# Patient Record
Sex: Male | Born: 1935 | Race: White | Hispanic: No | Marital: Married | Smoking: Never smoker
Health system: Southern US, Community
[De-identification: ages and names within clinical notes are randomized; demographics above are authoritative.]

## PROBLEM LIST (undated history)

## (undated) DIAGNOSIS — I4891 Unspecified atrial fibrillation: Secondary | ICD-10-CM

## (undated) DIAGNOSIS — E785 Hyperlipidemia, unspecified: Secondary | ICD-10-CM

## (undated) DIAGNOSIS — E119 Type 2 diabetes mellitus without complications: Secondary | ICD-10-CM

## (undated) DIAGNOSIS — I10 Essential (primary) hypertension: Secondary | ICD-10-CM

## (undated) DIAGNOSIS — M810 Age-related osteoporosis without current pathological fracture: Secondary | ICD-10-CM

## (undated) HISTORY — PX: SHOULDER ARTHROCENTESIS: SHX1048

## (undated) HISTORY — PX: CHOLECYSTECTOMY: SHX55

## (undated) HISTORY — PX: CARDIAC CATHETERIZATION: SHX172

## (undated) HISTORY — PX: OTHER SURGICAL HISTORY: SHX169

---

## 2004-07-09 ENCOUNTER — Ambulatory Visit: Payer: Self-pay | Admitting: Cardiology

## 2004-08-13 ENCOUNTER — Ambulatory Visit: Payer: Self-pay

## 2004-10-07 ENCOUNTER — Ambulatory Visit: Payer: Self-pay | Admitting: Orthopaedic Surgery

## 2004-10-30 ENCOUNTER — Ambulatory Visit: Payer: Self-pay | Admitting: Orthopaedic Surgery

## 2006-06-01 ENCOUNTER — Inpatient Hospital Stay: Payer: Self-pay | Admitting: Internal Medicine

## 2006-06-01 ENCOUNTER — Ambulatory Visit: Payer: Self-pay | Admitting: Internal Medicine

## 2006-07-12 ENCOUNTER — Ambulatory Visit: Payer: Self-pay | Admitting: Gastroenterology

## 2009-08-27 ENCOUNTER — Emergency Department: Payer: Self-pay | Admitting: Unknown Physician Specialty

## 2009-09-03 ENCOUNTER — Ambulatory Visit: Payer: Self-pay | Admitting: Orthopedic Surgery

## 2009-09-24 ENCOUNTER — Encounter: Payer: Self-pay | Admitting: Orthopedic Surgery

## 2009-09-27 ENCOUNTER — Encounter: Payer: Self-pay | Admitting: Orthopedic Surgery

## 2009-10-28 ENCOUNTER — Encounter: Payer: Self-pay | Admitting: Orthopedic Surgery

## 2009-11-28 ENCOUNTER — Encounter: Payer: Self-pay | Admitting: Orthopedic Surgery

## 2009-12-28 ENCOUNTER — Encounter: Payer: Self-pay | Admitting: Orthopedic Surgery

## 2011-02-10 ENCOUNTER — Ambulatory Visit: Payer: Self-pay | Admitting: Internal Medicine

## 2011-02-11 ENCOUNTER — Ambulatory Visit: Payer: Self-pay | Admitting: Gastroenterology

## 2011-02-12 ENCOUNTER — Ambulatory Visit: Payer: Self-pay | Admitting: Gastroenterology

## 2011-03-09 ENCOUNTER — Ambulatory Visit: Payer: Self-pay | Admitting: Surgery

## 2011-03-11 LAB — PATHOLOGY REPORT

## 2012-11-30 ENCOUNTER — Inpatient Hospital Stay: Payer: Self-pay | Admitting: Orthopedic Surgery

## 2012-11-30 LAB — CBC
HCT: 40.4 % (ref 40.0–52.0)
HGB: 13.8 g/dL (ref 13.0–18.0)
MCH: 31.4 pg (ref 26.0–34.0)
MCHC: 34.2 g/dL (ref 32.0–36.0)
Platelet: 170 10*3/uL (ref 150–440)
RBC: 4.4 10*6/uL (ref 4.40–5.90)
RDW: 13.5 % (ref 11.5–14.5)
WBC: 13.6 10*3/uL — ABNORMAL HIGH (ref 3.8–10.6)

## 2012-11-30 LAB — PROTIME-INR: Prothrombin Time: 14.1 secs (ref 11.5–14.7)

## 2012-11-30 LAB — URINALYSIS, COMPLETE
Bacteria: NONE SEEN
Glucose,UR: NEGATIVE mg/dL (ref 0–75)
Leukocyte Esterase: NEGATIVE
Nitrite: NEGATIVE
Ph: 5 (ref 4.5–8.0)
RBC,UR: NONE SEEN /HPF (ref 0–5)
Specific Gravity: 1.027 (ref 1.003–1.030)
Squamous Epithelial: <1
WBC UR: <1 /HPF (ref 0–5)

## 2012-11-30 LAB — COMPREHENSIVE METABOLIC PANEL
Alkaline Phosphatase: 71 U/L (ref 50–136)
Anion Gap: 5 — ABNORMAL LOW (ref 7–16)
Bilirubin,Total: 0.7 mg/dL (ref 0.2–1.0)
Calcium, Total: 9.3 mg/dL (ref 8.5–10.1)
Chloride: 114 mmol/L — ABNORMAL HIGH (ref 98–107)
Creatinine: 0.96 mg/dL (ref 0.60–1.30)
EGFR (African American): >60
SGOT(AST): 22 U/L (ref 15–37)
SGPT (ALT): 31 U/L (ref 12–78)
Sodium: 143 mmol/L (ref 136–145)

## 2012-11-30 LAB — APTT: Activated PTT: 25.5 secs (ref 23.6–35.9)

## 2012-12-02 LAB — COMPREHENSIVE METABOLIC PANEL
Alkaline Phosphatase: 75 U/L (ref 50–136)
BUN: 10 mg/dL (ref 7–18)
Calcium, Total: 8.7 mg/dL (ref 8.5–10.1)
Chloride: 106 mmol/L (ref 98–107)
EGFR (African American): >60
EGFR (Non-African Amer.): >60
Glucose: 137 mg/dL — ABNORMAL HIGH (ref 65–99)
Osmolality: 277 (ref 275–301)
SGPT (ALT): 23 U/L (ref 12–78)

## 2012-12-02 LAB — CBC WITH DIFFERENTIAL/PLATELET
Basophil #: 0 10*3/uL (ref 0.0–0.1)
Eosinophil #: 0 10*3/uL (ref 0.0–0.7)
Eosinophil %: 0.2 %
HGB: 12.2 g/dL — ABNORMAL LOW (ref 13.0–18.0)
MCHC: 34.5 g/dL (ref 32.0–36.0)
Monocyte #: 1 x10 3/mm (ref 0.2–1.0)
Monocyte %: 8 %
RBC: 3.91 10*6/uL — ABNORMAL LOW (ref 4.40–5.90)

## 2012-12-02 LAB — URINALYSIS, COMPLETE
Bacteria: NONE SEEN
Bilirubin,UR: NEGATIVE
Nitrite: NEGATIVE
Ph: 5 (ref 4.5–8.0)
Protein: NEGATIVE
Squamous Epithelial: <1

## 2012-12-03 LAB — CBC WITH DIFFERENTIAL/PLATELET
Basophil #: 0 10*3/uL (ref 0.0–0.1)
Basophil %: 0.4 %
Eosinophil %: 0.8 %
HCT: 37.6 % — ABNORMAL LOW (ref 40.0–52.0)
MCH: 31.5 pg (ref 26.0–34.0)
MCHC: 33.9 g/dL (ref 32.0–36.0)
MCV: 93 fL (ref 80–100)
Monocyte #: 1.7 x10 3/mm — ABNORMAL HIGH (ref 0.2–1.0)
Neutrophil #: 8.6 10*3/uL — ABNORMAL HIGH (ref 1.4–6.5)
RBC: 4.04 10*6/uL — ABNORMAL LOW (ref 4.40–5.90)
RDW: 13.5 % (ref 11.5–14.5)

## 2012-12-04 LAB — CBC WITH DIFFERENTIAL/PLATELET
Basophil #: 0 10*3/uL (ref 0.0–0.1)
Eosinophil #: 0.2 10*3/uL (ref 0.0–0.7)
Eosinophil %: 1.8 %
HCT: 33.2 % — ABNORMAL LOW (ref 40.0–52.0)
Lymphocyte #: 1.6 10*3/uL (ref 1.0–3.6)
MCH: 32.1 pg (ref 26.0–34.0)
MCHC: 34.7 g/dL (ref 32.0–36.0)
Monocyte #: 1.2 x10 3/mm — ABNORMAL HIGH (ref 0.2–1.0)
Monocyte %: 12.6 %
Platelet: 123 10*3/uL — ABNORMAL LOW (ref 150–440)
RBC: 3.59 10*6/uL — ABNORMAL LOW (ref 4.40–5.90)

## 2012-12-04 LAB — URINALYSIS, COMPLETE
Bacteria: NONE SEEN
Blood: NEGATIVE
Leukocyte Esterase: NEGATIVE
Nitrite: NEGATIVE
Protein: NEGATIVE
Specific Gravity: 1.014 (ref 1.003–1.030)
Squamous Epithelial: <1

## 2012-12-04 LAB — BASIC METABOLIC PANEL
BUN: 13 mg/dL (ref 7–18)
Co2: 31 mmol/L (ref 21–32)
EGFR (Non-African Amer.): >60
Glucose: 101 mg/dL — ABNORMAL HIGH (ref 65–99)
Potassium: 4.7 mmol/L (ref 3.5–5.1)
Sodium: 139 mmol/L (ref 136–145)

## 2012-12-05 ENCOUNTER — Encounter: Payer: Self-pay | Admitting: Internal Medicine

## 2012-12-13 LAB — FOLATE: Folic Acid: 30.1 ng/mL (ref 3.1–100.0)

## 2012-12-16 ENCOUNTER — Ambulatory Visit: Payer: Self-pay | Admitting: Orthopedic Surgery

## 2012-12-28 ENCOUNTER — Encounter: Payer: Self-pay | Admitting: Internal Medicine

## 2013-01-02 ENCOUNTER — Ambulatory Visit: Payer: Self-pay | Admitting: Orthopedic Surgery

## 2013-07-08 DIAGNOSIS — Z9889 Other specified postprocedural states: Secondary | ICD-10-CM | POA: Insufficient documentation

## 2013-07-08 DIAGNOSIS — I2510 Atherosclerotic heart disease of native coronary artery without angina pectoris: Secondary | ICD-10-CM | POA: Insufficient documentation

## 2013-08-27 DIAGNOSIS — E785 Hyperlipidemia, unspecified: Secondary | ICD-10-CM | POA: Diagnosis present

## 2013-08-27 DIAGNOSIS — M519 Unspecified thoracic, thoracolumbar and lumbosacral intervertebral disc disorder: Secondary | ICD-10-CM | POA: Insufficient documentation

## 2013-08-27 DIAGNOSIS — M810 Age-related osteoporosis without current pathological fracture: Secondary | ICD-10-CM | POA: Insufficient documentation

## 2013-08-27 DIAGNOSIS — E119 Type 2 diabetes mellitus without complications: Secondary | ICD-10-CM | POA: Insufficient documentation

## 2013-08-27 DIAGNOSIS — K209 Esophagitis, unspecified without bleeding: Secondary | ICD-10-CM | POA: Insufficient documentation

## 2014-05-24 IMAGING — CR RIGHT ANKLE - COMPLETE 3+ VIEW
1 series · 3 of 3 positions shown · non-contrast
Comparison: none

REASON FOR EXAM: right ankle injury 11-30-12 surgery 12-01-12 Dx post op Rt
ankle
COMMENTS:

[Series 1: ap · 0.17mm/px · 3 of 3 slices shown]
[im 1/3]
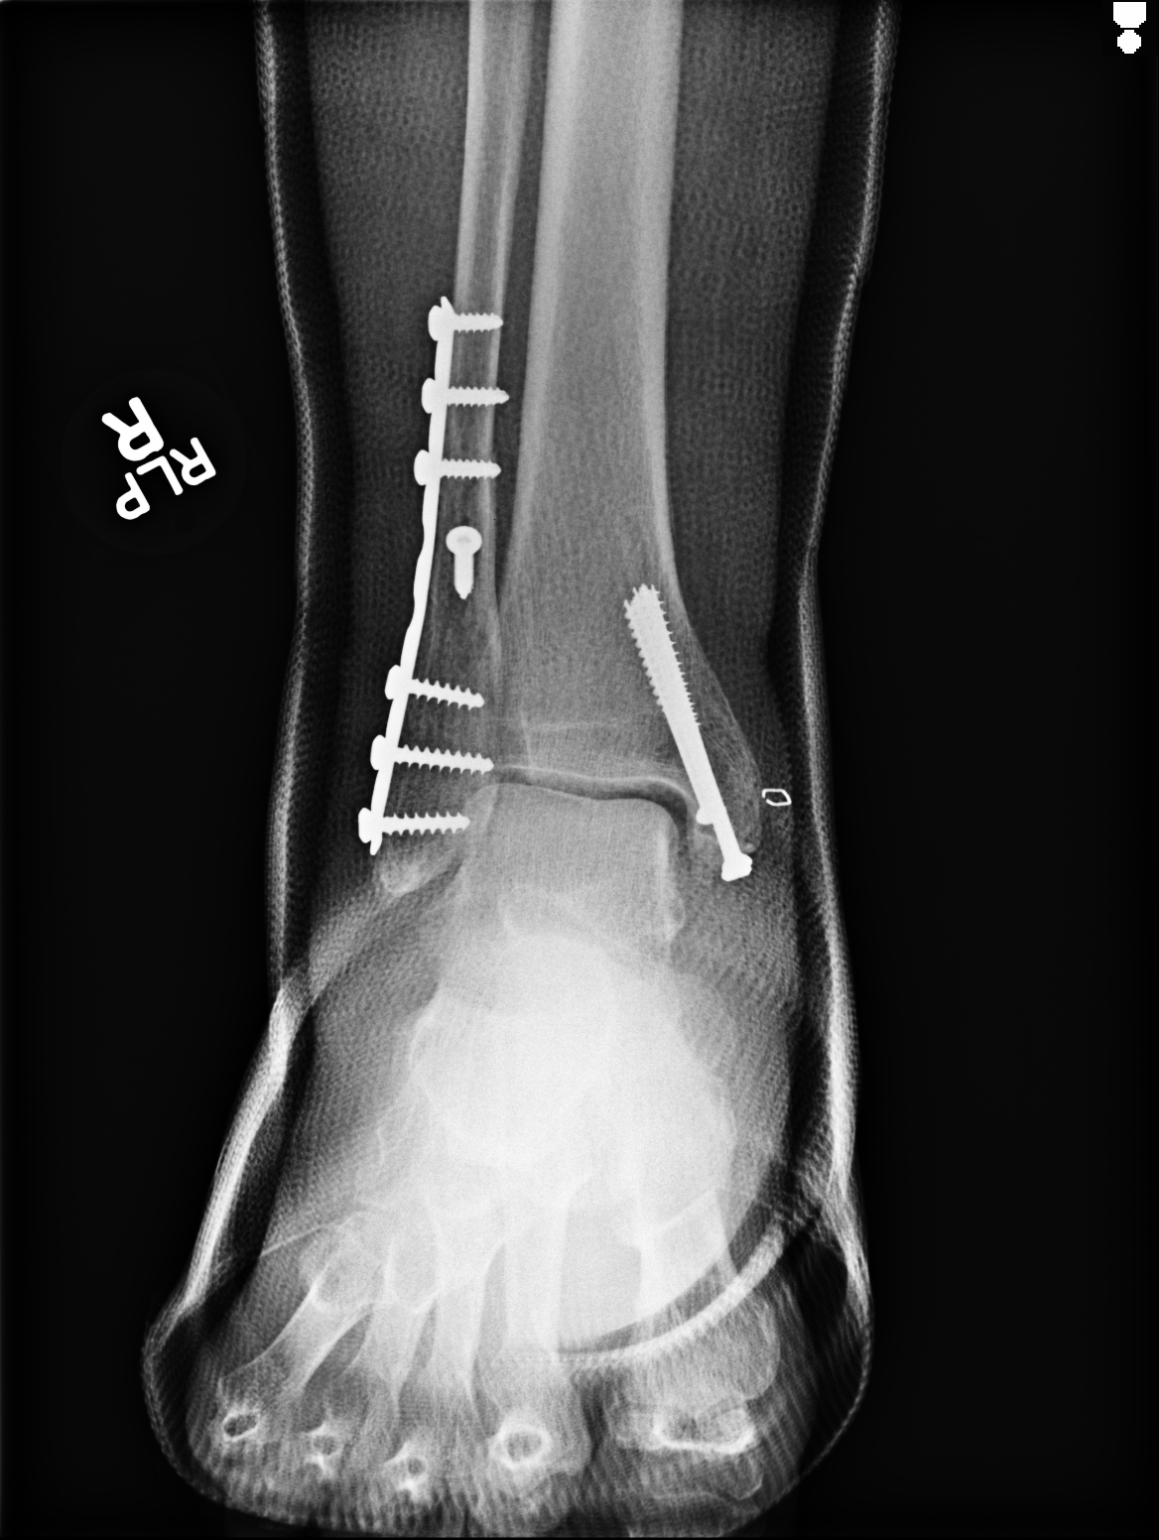
[im 2/3]
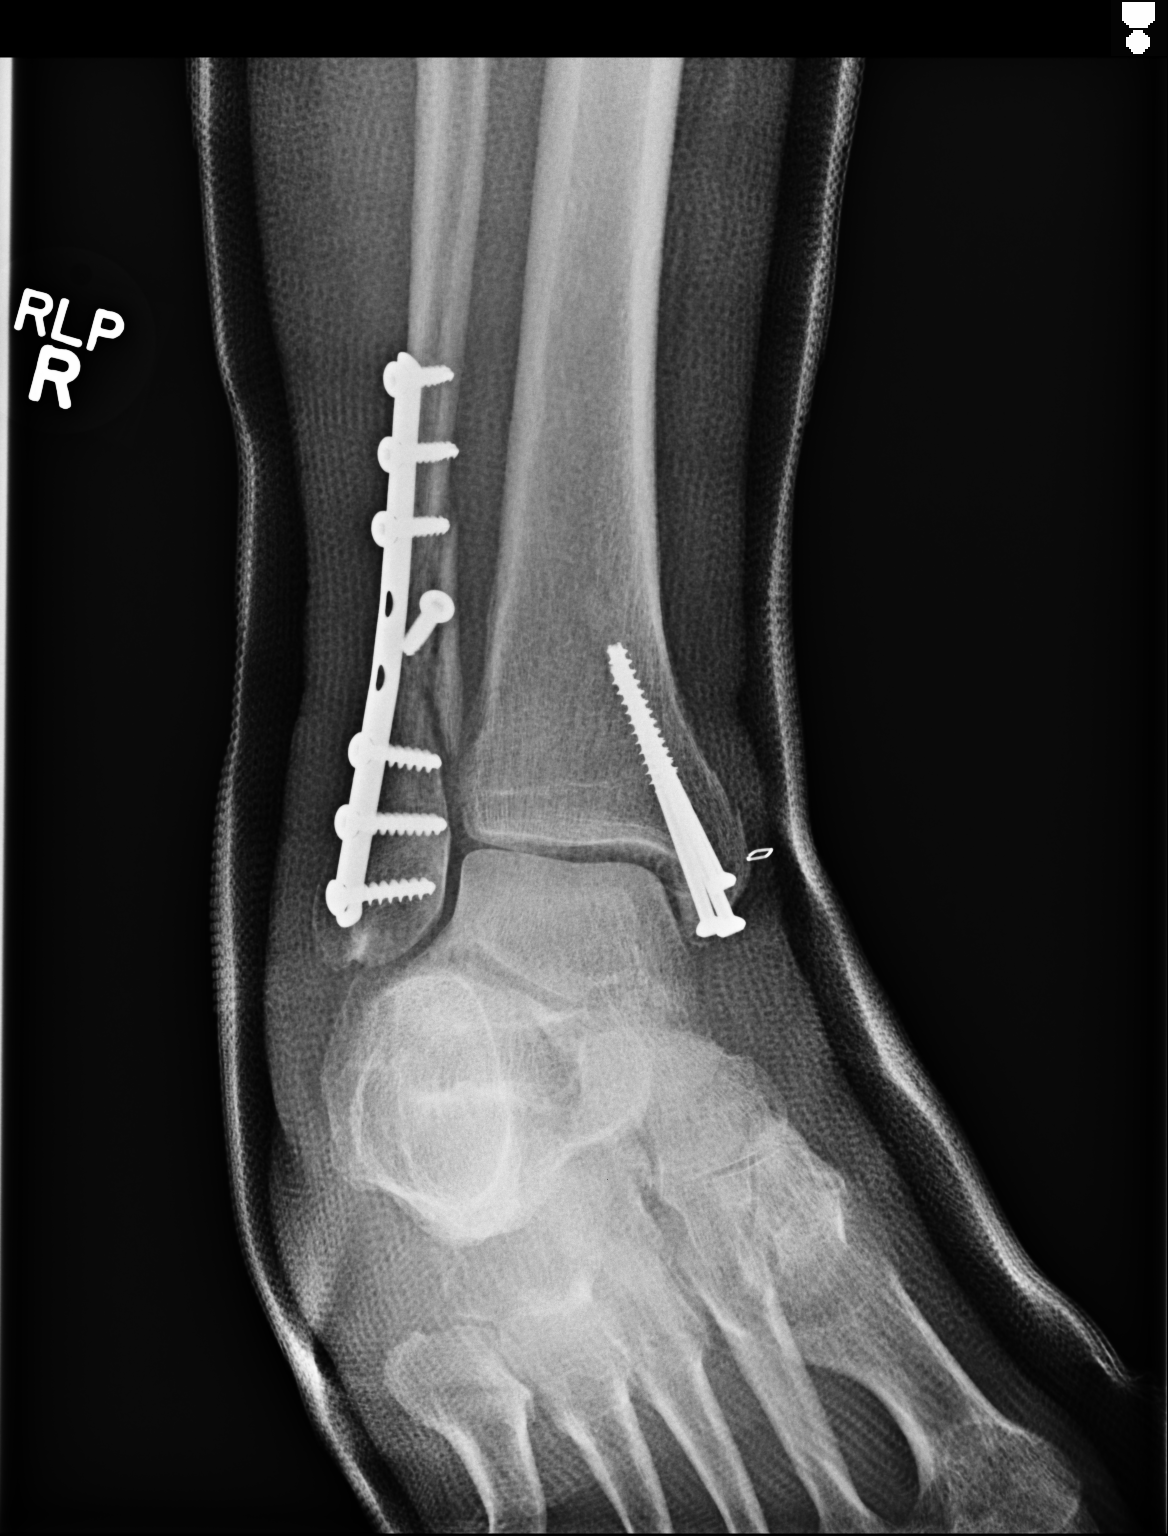
[im 3/3]
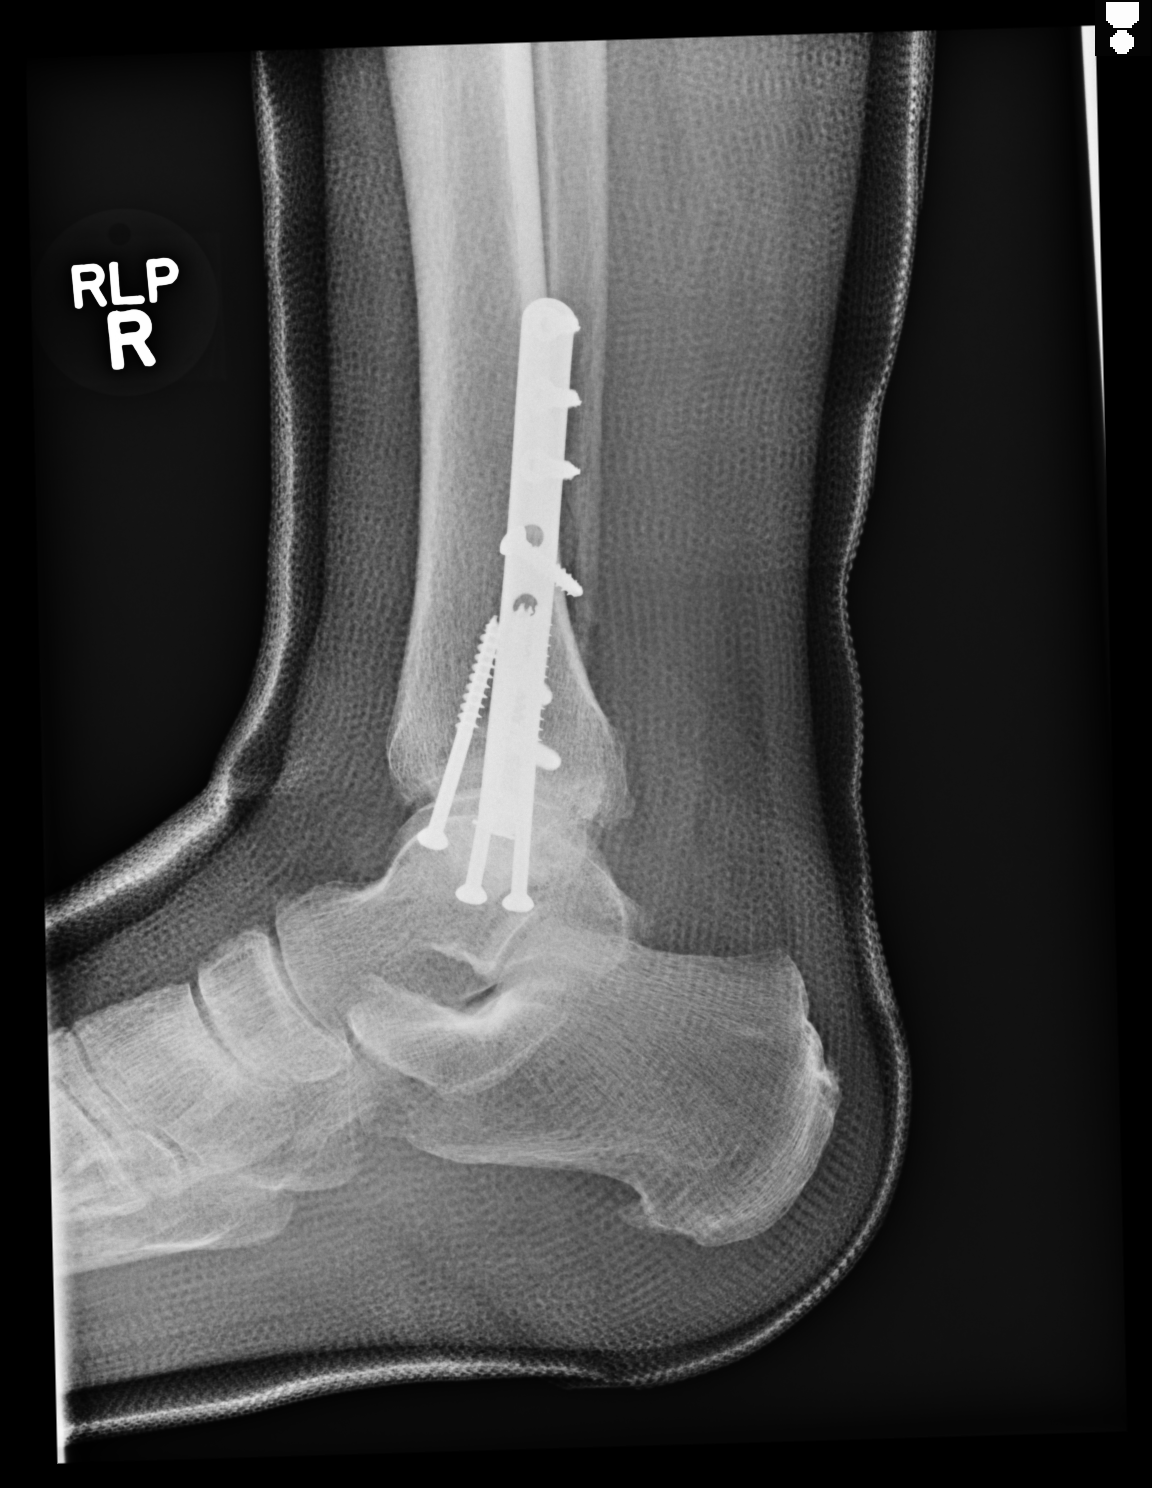

[3 of 3 positions shown; findings below may reference images not displayed]

PROCEDURE:     DXR - DXR ANKLE RIGHT COMPLETE  - December 16, 2012  [DATE]

RESULT:     And casting material is present. Skin staples have been removed
medially and laterally compare to the previous study of 12/01/2012. A single
staple remains over the medial malleolus. There is a lateral fibular
sideplate with multiple fixation screws present. There is a single screw in
oblique anterior-posterior/inferior direction anterior to posterior in the
distal fibula. Three screws is seen through the medial malleolus into the
tibial shaft. Alignment is maintained. There is minimal spurring in the
plantar region of the calcaneus. There is no hardware or bony complication.
IMPRESSION: Please see above.

[REDACTED]

## 2014-06-10 IMAGING — CR DG KNEE 1-2V*R*
1 series · 2 of 2 positions shown · non-contrast
Comparison: none

REASON FOR EXAM: Supine /Pain
COMMENTS:

[Series 1: ap · 0.17mm/px · 2 of 2 slices shown]
[im 1/2]
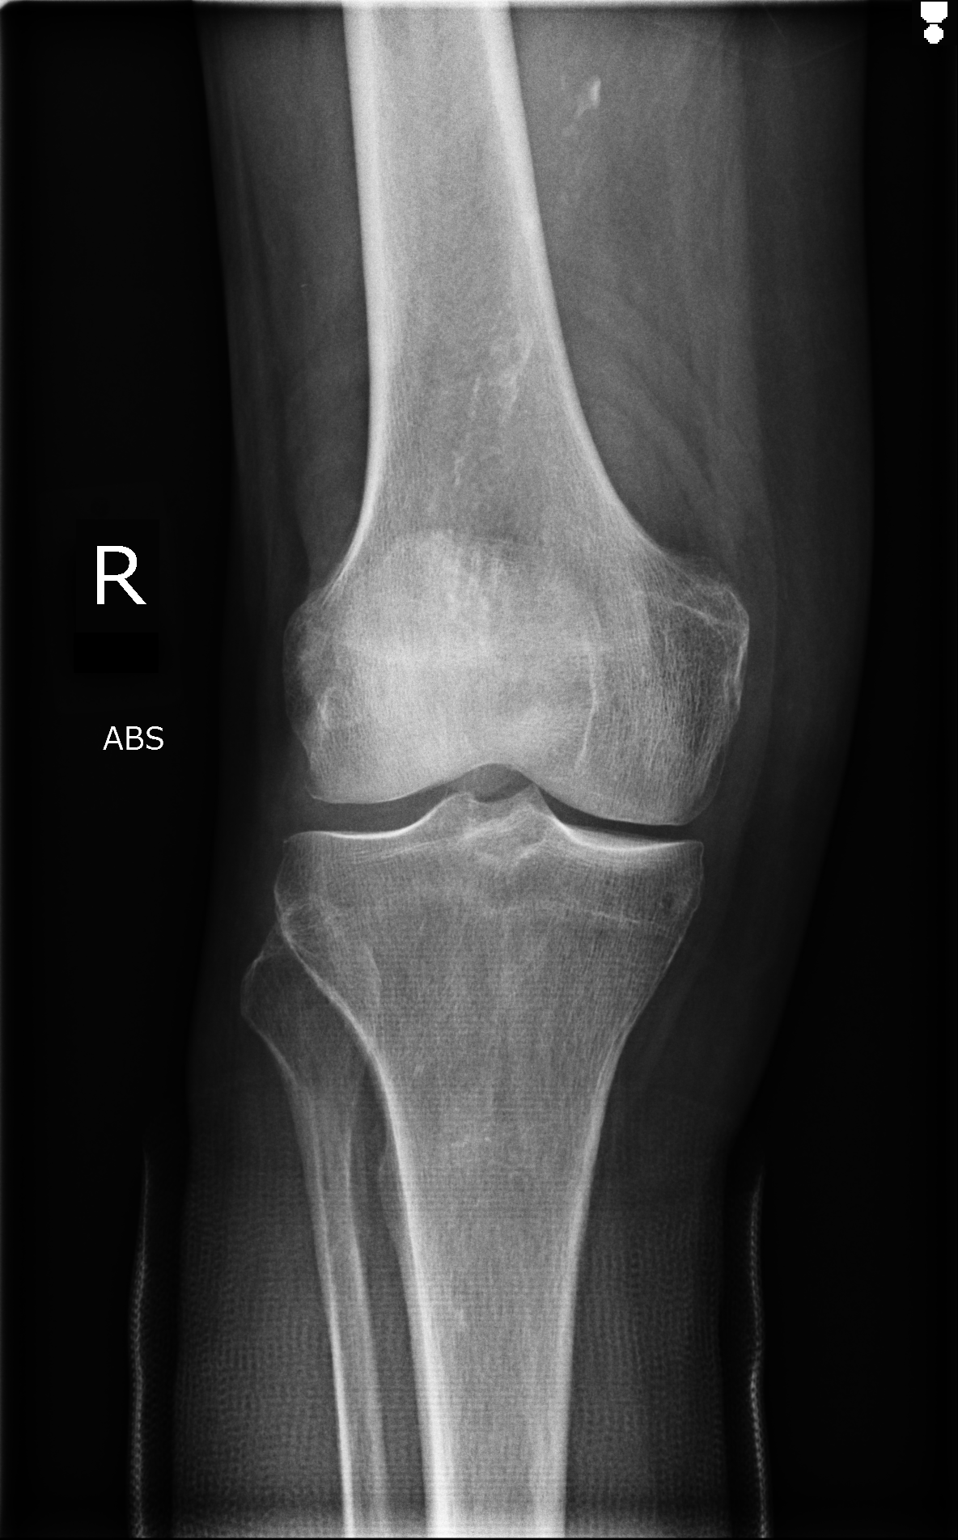
[im 2/2]
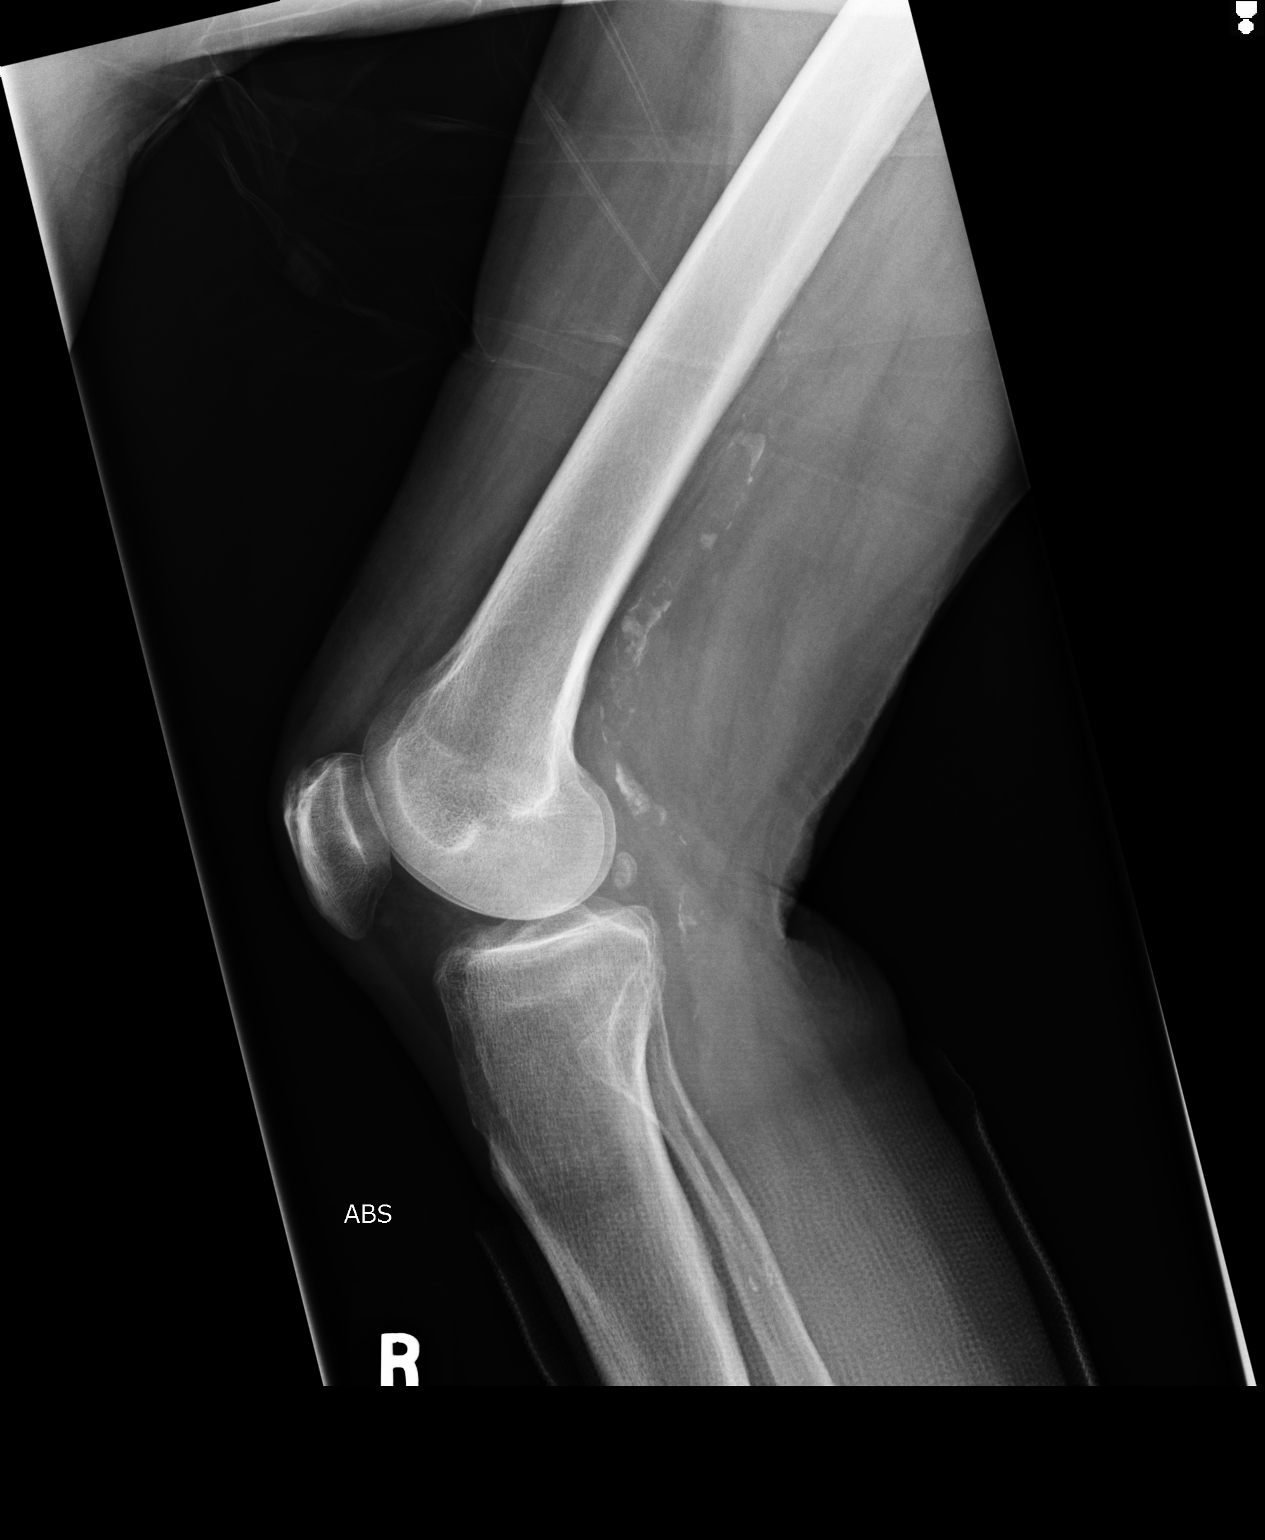

[2 of 2 positions shown; findings below may reference images not displayed]

PROCEDURE:     DXR - DXR KNEE RIGHT AP AND LATERAL  - January 02, 2013  [DATE]

RESULT:     Right knee images demonstrate atherosclerotic calcification in
the aorta. There is no fracture, dislocation or foreign body. There is mild
degenerative narrowing without bony erosion or hypertrophic spurring. No
joint effusion is appreciated.
IMPRESSION: Please see above.

[REDACTED]

## 2014-07-20 NOTE — Consult Note (Signed)
PATIENT NAME:  Rodney Navarro, Rodney Navarro MR#:  [id number] DATE OF BIRTH:  08/25/1924  DATE OF CONSULTATION:  11/30/2012  REFERRING PHYSICIAN:   CONSULTING PHYSICIAN:  Danella PentonMark F. Zairah Arista, MD  HISTORY OF PRESENT ILLNESS:  An elderly male who presents for consultation from Dr. Martha ClanKrasinski for preoperative evaluation and coronary artery disease. The patient has diet-controlled diabetes mellitus with 60% LAD by heart catheterization in 2006. He has had a history of atrial fibrillation but nothing of recent. His blood pressure has been controlled. He denies any angina or anginal equivalents, no shortness of breath, orthopnea or PND. He fell down a walkway today, suffering trimalleolar right ankle fracture and going for surgery tomorrow. Dr. Martha ClanKrasinski of orthopedics is seeing him.   PAST MEDICAL HISTORY:  Hyperlipidemia, history of A. fib, coronary artery disease, 60% LAD by cath March 2006, diabetes mellitus, diet-controlled.   PAST SURGICAL HISTORY 1.  Right shoulder surgery.  2.  Cholecystectomy.   ALLERGIES: ACTONEL, FOSAMAX.   MEDICATIONS:  Aspirin 325 mg daily, Toprol-XL 150 mg b.i.d., Norvasc 5 mg daily, Zovirax 200 mg t.i.d. p.r.n.   SOCIAL HISTORY: Stopped smoking 2003. No alcohol. Three children, married, retired from [employer], enjoys golfing.   FAMILY HISTORY: Father died of MI at age 79. Mom died at age 391.    REVIEW OF SYSTEMS: Otherwise negative.   PHYSICAL EXAMINATION VITAL SIGNS: Blood pressure 150/85, pulse 80, weight 166.  NECK: No thyromegaly or bruits.  LUNGS: Clear to a auscultation and percussion.  CHEST WALL: Tenderness right lateral wall. No bruising.  HEART: Regular rhythm. No audible murmur.  ABDOMEN: Good bowel sounds, soft, nontender.  EXTREMITIES: Left leg normal with good pulse. Right leg moderately swollen with purpura but capillary refill in the toes.   ASSESSMENT AND PLAN 1.  Coronary artery disease. Continue beta blocker, hold Norvasc preoperatively.  2.  Diabetes  mellitus, diet-controlled. Use sliding scale insulin for close control.  3.  Back pain. Pain medications as needed.   ____________________________ Danella PentonMark F. Malayiah Mcbrayer, MD mfm:cs D: 11/30/2012 18:27:31 ET T: 11/30/2012 18:47:20 ET JOB#: [id number]  cc: Danella PentonMark F. Sharonlee Nine, MD, <Dictator> Kathreen DevoidKevin Navarro. Krasinski, MD Danella PentonMARK F Declan Mier MD ELECTRONICALLY SIGNED 12/01/2012 7:59

## 2014-07-20 NOTE — H&P (Signed)
Subjective/Chief Complaint Right ankle pain   History of Present Illness Patient is an 81 year old male who was walking down a ramp today and stepped on a hose.  The hose rolled, causing him to lose his balance and fall.  He twisted his right leg durign the fall and was unable to stand or bear weight after the injury.  He denies other injuries.   Past Med/Surgical Hx:  HX skin cancer:   hyperlipidemia:   esophageal stricture:   chronic LBP:   h/o tachycardia:   osteoporosis:   CAD:   HTN:   acid reflux:   Right Shoulder Surgery:   Hx Back Surgery:   ALLERGIES:  Tetanus Toxoid: Swelling  HOME MEDICATIONS: Medication Instructions Status  metoprolol succinate 25 mg oral tablet 1 tab(s) orally 2 times a day  Active  Centrum Silver 1 tab(s) orally once a day AM Active  aspirin 81 mg oral tablet 1 tab(s) orally once a day in AM (stopped for surgery) Active   Family and Social History:  Family History Non-Contributory   Place of Living Home   Review of Systems:  Subjective/Chief Complaint Right ankle pain   Physical Exam:  GEN no acute distress   HEENT PERRL, hearing intact to voice, moist oral mucosa, Oropharynx clear, Wearing glasses   NECK supple  No masses   RESP normal resp effort  clear BS  no use of accessory muscles   ABD denies tenderness  soft  normal BS  no Adominal Mass   LYMPH negative neck   EXTR Right lower extremity is wrapped up in a posterior splint.  His toes are well perfused.  There is a subtle external roatation deformity to the ankle.  He can flex and extend his toes and his toes are well perfused.   NEURO motor/sensory function intact   PSYCH alert   Lab Results: Hepatic:  03-Sep-14 17:05   Bilirubin, Total 0.7  Alkaline Phosphatase 71  SGPT (ALT) 31  SGOT (AST) 22  Total Protein, Serum 6.7  Albumin, Serum 3.5  Routine Chem:  03-Sep-14 17:05   Glucose, Serum  111  BUN 17  Creatinine (comp) 0.96  Sodium, Serum 143  Potassium,  Serum 4.0  Chloride, Serum  114  CO2, Serum 24  Calcium (Total), Serum 9.3  Osmolality (calc) 287  eGFR (African American) >60  eGFR (Non-African American) >60 (eGFR values <62m/min/1.73 m2 may be an indication of chronic kidney disease (CKD). Calculated eGFR is useful in patients with stable renal function. The eGFR calculation will not be reliable in acutely ill patients when serum creatinine is changing rapidly. It is not useful in  patients on dialysis. The eGFR calculation may not be applicable to patients at the low and high extremes of body sizes, pregnant women, and vegetarians.)  Anion Gap  5  Routine UA:  03-Sep-14 17:05   Color (UA) Yellow  Clarity (UA) Hazy  Glucose (UA) Negative  Bilirubin (UA) Negative  Ketones (UA) 1+  Specific Gravity (UA) 1.027  Blood (UA) Negative  pH (UA) 5.0  Protein (UA) Negative  Nitrite (UA) Negative  Leukocyte Esterase (UA) Negative (Result(s) reported on 30 Nov 2012 at 07:56PM.)  RBC (UA) NONE SEEN  WBC (UA) <1 /HPF  Bacteria (UA) NONE SEEN  Epithelial Cells (UA) <1 /HPF  Mucous (UA) PRESENT (Result(s) reported on 30 Nov 2012 at 07:56PM.)  Routine Coag:  03-Sep-14 17:05   Prothrombin 14.1  INR 1.1 (INR reference interval applies to patients on anticoagulant  therapy. A single INR therapeutic range for coumarins is not optimal for all indications; however, the suggested range for most indications is 2.0 - 3.0. Exceptions to the INR Reference Range may include: Prosthetic heart valves, acute myocardial infarction, prevention of myocardial infarction, and combinations of aspirin and anticoagulant. The need for a higher or lower target INR must be assessed individually. Reference: The Pharmacology and Management of the Vitamin K  antagonists: the seventh ACCP Conference on Antithrombotic and Thrombolytic Therapy. MHDQQ.2297 Sept:126 (3suppl): [id number]. A HCT value >55% may artifactually increase the PT.  In one study,  the  increase was an average of 25%. Reference:  "Effect on Routine and Special Coagulation Testing Values of Citrate Anticoagulant Adjustment in Patients with High HCT Values." American Journal of Clinical Pathology 2006;126:400-405.)  Activated PTT (APTT) 25.5 (A HCT value >55% may artifactually increase the APTT. In one study, the increase was an average of 19%. Reference: "Effect on Routine and Special Coagulation Testing Values of Citrate Anticoagulant Adjustment in Patients with High HCT Values." American Journal of Clinical Pathology 2006;126:400-405.)  Routine Hem:  03-Sep-14 17:05   WBC (CBC)  13.6  RBC (CBC) 4.40  Hemoglobin (CBC) 13.8  Hematocrit (CBC) 40.4  Platelet Count (CBC) 170 (Result(s) reported on 30 Nov 2012 at 05:42PM.)  MCV 92  MCH 31.4  MCHC 34.2  RDW 13.5   Radiology Results: XRay:    03-Sep-14 17:30, Ankle Right Complete  Ankle Right Complete  REASON FOR EXAM:    fall, deformity  COMMENTS:       PROCEDURE: DXR - DXR ANKLE RIGHT COMPLETE  - Nov 30 2012  5:30PM     RESULT: Oblique fracture the distal fibular shaft is noted. Fracture of   the medial malleolus is present. Fracture the posterior malleolus is   present.    IMPRESSION:  Trimalleolar fracture with displaced fracture fragments.        Verified By: Osa Craver, M.D., MD    03-Sep-14 17:30, Chest PA and Lateral  Chest PA and Lateral  REASON FOR EXAM:    fall injury, right rib pain  COMMENTS:       PROCEDURE: DXR - DXR CHEST PA (OR AP) AND LATERAL  - Nov 30 2012  5:30PM     RESULT: Mediastinum is normal. Heart is normal. Pleural thickening noted   on left is unchanged consistent scarring. The lungs are clear. Heart size   normal. Degenerative changes thoracic spine.    IMPRESSION:  Left pleural scarring. Stable chest from 06/07/2006. No acute   abnormality. No acute bony abnormality. No pneumothorax.        Verified By: Osa Craver, M.D., MD    03-Sep-14 17:30, Tibia And  Fibula Right  Tibia And Fibula Right  REASON FOR EXAM:    fall injury  COMMENTS:       PROCEDURE: DXR - DXR TIBIA AND FIBULA RT (LOWER L  - Nov 30 2012  5:30PM     RESULT: Vascular calcification. Degenerative changes about the knee.   Trimalleolar fracture present about the ankle. Upper tibia/ fibula are   unremarkable.    IMPRESSION:  Trimalleolar fracture of the ankle.        Verified By: Osa Craver, M.D., MD    03-Sep-14 18:22, Knee Right Complete  Knee Right Complete  REASON FOR EXAM:    fall injury  COMMENTS:       PROCEDURE: DXR - DXR KNEE RT COMP WITH OBLIQUES  -  Nov 30 2012  6:22PM     RESULT: Vascular calcification. No acute bony or joint abnormality. Mild   degenerative change.    IMPRESSION:  Vascular calcification and mild degenerative change. No   acute abnormality.        Verified By: Osa Craver, M.D., MD  LabUnknown:    03-Sep-14 17:30, Ankle Right Complete  PACS Image    03-Sep-14 17:30, Chest PA and Lateral  PACS Image    03-Sep-14 17:30, Tibia And Fibula Right  PACS Image    03-Sep-14 18:22, Knee Right Complete  PACS Image    Assessment/Admission Diagnosis Right trimalleolar fracture   Plan I have recommended to the patient that he undergo internal fixation for his trimalleolar ankle fracture.  The posterior malleolar fracture is small enough that I do not anticipate it will need additional fixation.  Patient will be NPO after midnight.  I explained the details of the operation with him.  He has been seen by Dr. Emily Filbert and cleared for surgery.  I have reviewed the patient's labs and radiographs.  Surgery will be scheduled for tomorrow.   Electronic Signatures: [place] Park (MD)  (Signed 03-Sep-14 23:21)  Authored: CHIEF COMPLAINT and HISTORY, PAST MEDICAL/SURGIAL HISTORY, ALLERGIES, HOME MEDICATIONS, FAMILY AND SOCIAL HISTORY, REVIEW OF SYSTEMS, PHYSICAL EXAM, LABS, Radiology, ASSESSMENT AND PLAN   Last Updated: 03-Sep-14  23:21 by Thornton Park (MD)

## 2014-07-20 NOTE — Op Note (Signed)
PATIENT NAME:  EAN, GETTEL MR#:  [id number] DATE OF BIRTH:  03-11-1925  DATE OF PROCEDURE:  12/01/2012  PREOPERATIVE DIAGNOSIS: Right trimalleolar ankle fracture.   POSTOPERATIVE DIAGNOSIS: Right trimalleolar ankle fracture.    PROCEDURE: Open reduction and internal fixation of medial and lateral malleolar fractures.   SURGEON: Juanell Fairly, M.D.   ANESTHESIA: Spinal.   ESTIMATED BLOOD LOSS: Minimal.   COMPLICATIONS: None.   TOURNIQUET TIME: 81 minutes.   IMPLANTS: Synthes 8-hole, one-third tubular plate for fibular fixation and three 4-0 cannulated screws for medial malleolar fixation.   INDICATIONS FOR PROCEDURE: Mr. Witherington is an 81 year old male who slipped on a hose walking down an incline. He sustained a twisting injury to the right ankle. He was unable to bear weight or stand following the injury and was diagnosed with trimalleolar ankle fracture upon arrival at the [facility] Emergency Department. He was placed in a posterior splint. The patient did not suffer a dislocation. He was admitted to the orthopedic surgery service. The patient did have rotational deformity to the right lower extremity. I discussed the risks and benefits of surgery with the patient and he elected to proceed with surgical fixation. His daughter was present during the discussion of the risks and benefits of surgery. The patient had a small trimalleolar fragment and it was felt that the patient would only require fixation of the lateral and the medial malleolus to stabilize the ankle. The patient understood the risks of surgery include infection, bleeding, nerve or blood vessel injury especially injury to the superficial peroneal nerve which would lead to permanent dorsal foot numbness, painful hardware, failure of the hardware, ankle stiffness or osteoarthritis, persistent pain and the need for further surgery. Medical complications would include, but are not limited to, deep vein thrombosis and  pulmonary embolism, myocardial infarction, stroke, pneumonia, respiratory failure and death. The patient understood these risks and wished to proceed.   PROCEDURE: The patient was brought to the operating room where he was placed supine on the operative table. All bony prominences were adequately padded. The patient underwent placement of the spinal anesthetic by the anesthesia service. His right lower extremity was then prepped and draped in a sterile fashion.   A timeout was performed to verify the patient's name, date of birth, medical record number, correct site of surgery, and correct procedure to be performed. It was also used to verify the patient had received antibiotics and that all appropriate instruments, implants and radiographic studies were available in the room. Once all in attendance were in agreement, the case began.   The patient had the right lower extremity exsanguinated with an Esmarch. Tourniquet was inflated to 275 mmHg. This was inflated for a total of 81 minutes.   The proposed medial and lateral incisions were drawn out with a surgical marker, based upon bony landmarks and FluoroScan imaging. A 15 blade was used to make a longitudinal lateral incision centered over the fibula. The subcutaneous tissues were carefully dissected with a Metzenbaum scissor and pick-up. Care was taken to avoid injury to the superficial peroneal nerve. The fibular fracture was then identified. A curette was used to remove fracture hematoma. Fracture site was then copiously irrigated and fracture reduction clamp was used to reduce the fracture. A single 3.5 mm lag screw was then placed perpendicular to the fracture after being drilled with a 2.7 mm drill using lag screw technique. A Synthes 8-hole one-third tubular plate was then contoured to the lateral aspect of the distal fibula.  Three cancellous screws were placed below the fracture and three bicortical screws were placed superior to the fracture.  Intraoperative FluoroScan imaging was used to confirm reduction of the fracture and placement of the hardware. The length of the screws was measured using a depth gauge. Once all the screws were in place, the lateral wound was copiously irrigated and a moist Ray-Tec was placed in the wound. The attention was then turned to medial malleolar fixation.   A vertical incision centered over the medial malleolus was then created using a 15 blade. Again, the subcutaneous tissues were carefully dissected to the expose the medial malleolus fracture. Again, the fracture hematoma was curetted and the wound was copiously irrigated. The medial malleolar fracture was opened so the joint could be inspected. There is no focal chondral loss or free fragments. The medial malleolar fracture was then reduced using a dental pick. Three threaded K wires were then placed through the tip of the medial malleolus into the distal tibia. The reduction of the fracture was confirmed on AP and lateral FluoroScan imaging. The depth gauge was used to measure screw length. A cannulated drill was then used to over drill the threaded K wires. Three 4-0 cannulated screws were then advanced across the fracture site. These long threaded screws. Two were 50 mm in length and the third anterior most screw was 40 mm in length. FluoroScan imaging was then performed, which confirmed a symmetric mortise and no widening of the syndesmosis. The fractured of the medial and lateral malleoli well reduced. There was no subluxation of the talus in the medial, lateral or anterior posterior direction. A stress test was performed under fluoroscopy, which demonstrated no asymmetry of the mortise or widening of the medial clear space. A cotton test was also performed to ensure stability of the syndesmosis. Both surgical incisions were copiously irrigated. The subcutaneous tissue was closed with 2-0 Vicryl and the skin was approximated staples on both sides. The patient  had Xeroform along with a dry sterile dressing applied. He was placed in an AO splint. The patient was then transferred to a hospital bed and brought to the PAC-U in stable condition. I was scrubbed and present for the entire case and all sharp and instrument counts were correct at the conclusion of the case. I spoke with the patient's daughter postoperatively to let her know the case had gone without complication and the patient was stable in the recovery room.    ____________________________ Kathreen DevoidKevin L. Melat Wrisley, MD klk:cc D: 12/04/2012 23:57:00 ET T: 12/05/2012 01:09:16 ET JOB#: [id number]  cc: Kathreen DevoidKevin L. Nahome Bublitz, MD, <Dictator> Kathreen DevoidKEVIN L Uchenna Rappaport MD ELECTRONICALLY SIGNED 12/13/2012 8:21

## 2014-07-20 NOTE — Discharge Summary (Signed)
PATIENT NAME:  Rodney DecentSYKES, Rodney L MR#:  [id number] DATE OF BIRTH:  1924/11/09  DATE OF ADMISSION:  11/30/2012 DATE OF DISCHARGE:  12/05/2012  PRINCIPLE DIAGNOSIS: Right trimalleolar ankle fracture, status post open reduction internal fixation.   HISTORY OF PRESENT ILLNESS: Mr. [name] is an [age] year old male, who was walking down a ramp and stepped on a hose. The hose rolled underneath his foot causing him to lose his balance and fall. He sustained a twisting injury to the right leg during his fall and was unable to stand or bear weight after the injury. He was diagnosed with a trimalleolar ankle fracture without dislocation upon presentation to the [facility] Emergency Department. He was admitted to the orthopedic surgery service for further evaluation and management.   PAST MEDICAL HISTORY: Includes history of skin cancer, hyperlipidemia, esophageal stricture, chronic low back pain, history of tachycardia, osteoporosis, coronary artery disease, hypertension, gastroesophageal reflux, previous shoulder and back surgery.   ALLERGIES: TETANUS TOXOID.   HOME MEDICATIONS: Include metoprolol 25 mg b.i.d., centrum Silver and aspirin 81 mg daily.   HOSPITAL COURSE: The patient was admitted to the orthopedic surgery service on 11/30/2012. He was seen and cleared by Dr. Bethann PunchesMark Miller, his primary care physician for surgery. He was taken to the OR on 12/01/2012 for open reduction internal fixation of medial and lateral malleolar fractures. He had a spinal anesthetic for this.   Postoperatively, the patient returned to the orthopedic surgery floor. He was followed by orthopedics and [facility] medicine throughout his hospitalization. The patient received physical therapy while an inpatient. He was nonweightbearing on the right lower extremity throughout his hospitalization and used a walker for assistance with ambulation. He continued to have ice and elevate lower extremity postop. The patient was noted to  have some postop bradycardia, which resolved by postoperative day #2. He also had low-grade fevers postop with constipation. The constipation was treated medically and his low-grade fevers were likely the result of postoperative atelectasis. He was encouraged to continue with his incentive spirometry. His urinalyses were negative during this hospitalization. The patient continued to make expected progress postop. He was placed in a cast on postoperative day #2. The incisions were clean, dry, and intact. He remained neurovascularly intact throughout his hospitalization. Given his clinical improvement and advancing with physical therapy, he was prepared for discharge on postoperative day #4. On the day of discharge, his cast was clean and intact as well as dry. He remained neurovascularly intact. The patient was seen and cleared medically by Dr. Bethann PunchesMark Miller for discharge to rehab as well. The patient will continue with physical therapy and will remain nonweightbearing on the right lower extremity until he follows up in 2 weeks' time.   DISCHARGE INSTRUCTIONS: The patient will be discharged with instructions to continue physical and occupational therapy at his skilled nursing facility. He should continue to keep the right lower extremity elevated whenever he is in bed or in a chair. He will be nonweightbearing for a minimum of 6 weeks postop. He should continue Lovenox 30 mg b.i.d. for a total of 6 weeks postop. The patient will contact my office with any questions or concerns.  ____________________________ Kathreen DevoidKevin L. Amere Iott, MD klk:aw D: 12/05/2012 12:36:03 ET T: 12/05/2012 13:04:49 ET JOB#: [id number]  cc: Kathreen DevoidKevin L. Laurella Tull, MD, <Dictator> Danella PentonMark F. Miller, MD Kathreen DevoidKEVIN L Breeana Sawtelle MD ELECTRONICALLY SIGNED 12/13/2012 8:21

## 2014-09-18 DIAGNOSIS — C4442 Squamous cell carcinoma of skin of scalp and neck: Secondary | ICD-10-CM | POA: Insufficient documentation

## 2015-10-17 ENCOUNTER — Emergency Department: Payer: Medicare Other

## 2015-10-17 ENCOUNTER — Encounter: Payer: Self-pay | Admitting: Emergency Medicine

## 2015-10-17 ENCOUNTER — Observation Stay
Admission: EM | Admit: 2015-10-17 | Discharge: 2015-10-18 | Disposition: A | Discharge status: Home / Self Care | Payer: Medicare Other | Attending: Internal Medicine | Admitting: Internal Medicine

## 2015-10-17 DIAGNOSIS — E119 Type 2 diabetes mellitus without complications: Secondary | ICD-10-CM | POA: Insufficient documentation

## 2015-10-17 DIAGNOSIS — Y92009 Unspecified place in unspecified non-institutional (private) residence as the place of occurrence of the external cause: Secondary | ICD-10-CM | POA: Insufficient documentation

## 2015-10-17 DIAGNOSIS — Z9842 Cataract extraction status, left eye: Secondary | ICD-10-CM | POA: Insufficient documentation

## 2015-10-17 DIAGNOSIS — Y998 Other external cause status: Secondary | ICD-10-CM | POA: Diagnosis not present

## 2015-10-17 DIAGNOSIS — E860 Dehydration: Secondary | ICD-10-CM | POA: Insufficient documentation

## 2015-10-17 DIAGNOSIS — J8410 Pulmonary fibrosis, unspecified: Secondary | ICD-10-CM | POA: Insufficient documentation

## 2015-10-17 DIAGNOSIS — S0101XA Laceration without foreign body of scalp, initial encounter: Secondary | ICD-10-CM

## 2015-10-17 DIAGNOSIS — I440 Atrioventricular block, first degree: Secondary | ICD-10-CM | POA: Insufficient documentation

## 2015-10-17 DIAGNOSIS — J329 Chronic sinusitis, unspecified: Secondary | ICD-10-CM | POA: Insufficient documentation

## 2015-10-17 DIAGNOSIS — M810 Age-related osteoporosis without current pathological fracture: Secondary | ICD-10-CM | POA: Diagnosis not present

## 2015-10-17 DIAGNOSIS — Z7982 Long term (current) use of aspirin: Secondary | ICD-10-CM | POA: Diagnosis not present

## 2015-10-17 DIAGNOSIS — I083 Combined rheumatic disorders of mitral, aortic and tricuspid valves: Secondary | ICD-10-CM | POA: Insufficient documentation

## 2015-10-17 DIAGNOSIS — E785 Hyperlipidemia, unspecified: Secondary | ICD-10-CM | POA: Diagnosis not present

## 2015-10-17 DIAGNOSIS — J339 Nasal polyp, unspecified: Secondary | ICD-10-CM | POA: Diagnosis not present

## 2015-10-17 DIAGNOSIS — Z887 Allergy status to serum and vaccine status: Secondary | ICD-10-CM | POA: Diagnosis not present

## 2015-10-17 DIAGNOSIS — I480 Paroxysmal atrial fibrillation: Secondary | ICD-10-CM | POA: Insufficient documentation

## 2015-10-17 DIAGNOSIS — Y9389 Activity, other specified: Secondary | ICD-10-CM | POA: Insufficient documentation

## 2015-10-17 DIAGNOSIS — Z888 Allergy status to other drugs, medicaments and biological substances status: Secondary | ICD-10-CM | POA: Diagnosis not present

## 2015-10-17 DIAGNOSIS — I10 Essential (primary) hypertension: Secondary | ICD-10-CM | POA: Diagnosis not present

## 2015-10-17 DIAGNOSIS — I2510 Atherosclerotic heart disease of native coronary artery without angina pectoris: Secondary | ICD-10-CM | POA: Insufficient documentation

## 2015-10-17 DIAGNOSIS — Z9841 Cataract extraction status, right eye: Secondary | ICD-10-CM | POA: Insufficient documentation

## 2015-10-17 DIAGNOSIS — T148XXA Other injury of unspecified body region, initial encounter: Secondary | ICD-10-CM

## 2015-10-17 DIAGNOSIS — T148 Other injury of unspecified body region: Secondary | ICD-10-CM | POA: Diagnosis present

## 2015-10-17 DIAGNOSIS — W1830XA Fall on same level, unspecified, initial encounter: Secondary | ICD-10-CM | POA: Diagnosis not present

## 2015-10-17 DIAGNOSIS — R55 Syncope and collapse: Secondary | ICD-10-CM | POA: Diagnosis present

## 2015-10-17 HISTORY — DX: Age-related osteoporosis without current pathological fracture: M81.0

## 2015-10-17 HISTORY — DX: Unspecified atrial fibrillation: I48.91

## 2015-10-17 HISTORY — DX: Hyperlipidemia, unspecified: E78.5

## 2015-10-17 HISTORY — DX: Essential (primary) hypertension: I10

## 2015-10-17 HISTORY — DX: Type 2 diabetes mellitus without complications: E11.9

## 2015-10-17 LAB — CBC
HEMATOCRIT: 38.5 % — AB (ref 40.0–52.0)
HEMOGLOBIN: 13.5 g/dL (ref 13.0–18.0)
MCH: 32.6 pg (ref 26.0–34.0)
MCHC: 35.1 g/dL (ref 32.0–36.0)
MCV: 93.1 fL (ref 80.0–100.0)
Platelets: 149 10*3/uL — ABNORMAL LOW (ref 150–440)
RBC: 4.13 MIL/uL — ABNORMAL LOW (ref 4.40–5.90)
RDW: 14.2 % (ref 11.5–14.5)
WBC: 9.7 10*3/uL (ref 3.8–10.6)

## 2015-10-17 LAB — BASIC METABOLIC PANEL
ANION GAP: 4 — AB (ref 5–15)
BUN: 21 mg/dL — ABNORMAL HIGH (ref 6–20)
CO2: 29 mmol/L (ref 22–32)
Calcium: 9.6 mg/dL (ref 8.9–10.3)
Chloride: 108 mmol/L (ref 101–111)
Creatinine, Ser: 0.78 mg/dL (ref 0.61–1.24)
GFR calc Af Amer: >60 mL/min (ref >60)
GLUCOSE: 148 mg/dL — AB (ref 65–99)
POTASSIUM: 3.8 mmol/L (ref 3.5–5.1)
SODIUM: 141 mmol/L (ref 135–145)

## 2015-10-17 LAB — URINALYSIS COMPLETE WITH MICROSCOPIC (ARMC ONLY)
BILIRUBIN URINE: NEGATIVE
Bacteria, UA: NONE SEEN
Glucose, UA: 150 mg/dL — AB
Hgb urine dipstick: NEGATIVE
LEUKOCYTES UA: NEGATIVE
NITRITE: NEGATIVE
PH: 5 (ref 5.0–8.0)
Protein, ur: NEGATIVE mg/dL
Specific Gravity, Urine: 1.028 (ref 1.005–1.030)

## 2015-10-17 LAB — TROPONIN I: Troponin I: <0.03 ng/mL (ref <0.03)

## 2015-10-17 MED ORDER — ACETAMINOPHEN 500 MG PO TABS
1000.0000 mg | ORAL_TABLET | Freq: Once | ORAL | Status: AC
  Administered 2015-10-17: 1000 mg via ORAL
  Filled 2015-10-17: qty 2

## 2015-10-17 NOTE — ED Notes (Addendum)
Pt given [place] sandwich tray.   Pt has two staples to posterior head. Steri-strips covering R ear and R hand/wrist area.

## 2015-10-17 NOTE — ED Notes (Addendum)
States while walking up a ramp patient "blacked out and fell" hitting head. Patient was [facility] 10/15/2015 for c/o dizziness

## 2015-10-17 NOTE — ED Provider Notes (Signed)
[facility] Emergency Department Provider Note  ____________________________________________  Time seen: Approximately 9:21 PM  I have reviewed the triage vital signs and the nursing notes.   HISTORY  Chief Complaint Loss of Consciousness   HPI Rodney Navarro is a 82 y.o. male the history of A. fib not on any anticoagulants, hypertension, hyperlipidemia, native vessels CAD, and DM who presents for evaluation of syncope. Patient reports that he went outside to look for his cat and when he was walking up the driveway he had a syncopal episode. Patient reports that he had no preceding symptoms including lightheadedness, dizziness, palpitations, chest pain, shortness of breath. Patient denies any prior history of syncope. Patient reports that for the last month he's been having episodes of dizziness/lightheadedness. He denies having any today. Patient has a history of paroxysmal atrial fibrillation not on any blood thinners. Patient denies any current chest pain, shortness of breath, palpitations, nausea, abdominal pain. Patient endorses a moderate headache located in the right side of his head. He reports that he hit his head onto the asphalt during his syncope and sustain a scalp laceration. The headache has been constant since the syncope event and nonradiating. He denies neck pain, back pain, extremity pain. Patient reports that he is allergic to tetanus vaccine.   Past Medical History  Diagnosis Date  . Hyperlipidemia   . Hypertension   . A-fib (HCC)   . Osteoporosis   . Diabetes mellitus without complication (HCC)     There are no active problems to display for this patient.   No past surgical history on file.  No current outpatient prescriptions on file.  Allergies Tetanus toxoids  No family history on file.  Social History Social History  Substance Use Topics  . Smoking status: Never Smoker   . Smokeless tobacco: Not on file  . Alcohol Use: No      Review of Systems  Constitutional: Negative for fever. + syncope Eyes: Negative for visual changes. ENT: Negative for sore throat. Cardiovascular: Negative for chest pain. Respiratory: Negative for shortness of breath. Gastrointestinal: Negative for abdominal pain, vomiting or diarrhea. Genitourinary: Negative for dysuria. Musculoskeletal: Negative for back pain. Skin: Negative for rash. Neurological: Negative for headaches, weakness or numbness.  ____________________________________________   PHYSICAL EXAM:  VITAL SIGNS: ED Triage Vitals  Enc Vitals Group     BP 10/17/15 1758 139/60 mmHg     Pulse Rate 10/17/15 1758 75     Resp 10/17/15 1758 16     Temp 10/17/15 1758 98.2 F (36.8 C)     Temp Source 10/17/15 1758 Oral     SpO2 10/17/15 1758 97 %     Weight 10/17/15 1758 158 lb (71.668 kg)     Height 10/17/15 1758 5\' 8"  (1.727 m)     Head Cir --      Peak Flow --      Pain Score 10/17/15 1801 0     Pain Loc --      Pain Edu? --      Excl. in GC? --     Constitutional: Alert and oriented. Well appearing and in no apparent distress. HEENT:      Head: Normocephalic and atraumatic. 2cm laceration on the R scalp.         Eyes: Conjunctivae are normal. Sclera is non-icteric. EOMI. PERRL, no raccoon eyes      Ear: abrasion to the R external ear, no hemotympanum, no battle sign  Mouth/Throat: Mucous membranes are moist.       Neck: Supple with no signs of meningismus. Cardiovascular: Regular rate and rhythm. No murmurs, gallops, or rubs. 2+ symmetrical distal pulses are present in all extremities. No JVD. Respiratory: Normal respiratory effort. Lungs are clear to auscultation bilaterally. No wheezes, crackles, or rhonchi.  Gastrointestinal: Soft, non tender, and non distended with positive bowel sounds. No rebound or guarding. Genitourinary: No CVA tenderness. Musculoskeletal: Swelling and bruising over the R wrist no ttp, no tenderness with axial loading of the  thumb, no ttp in the snuffbox. Nontender with normal range of motion in all extremities and b/l hips. No c/t/lspine ttp. No edema, cyanosis, or erythema of extremities. Neurologic: Normal speech and language. Face is symmetric. Moving all extremities. No gross focal neurologic deficits are appreciated. Skin: Skin is warm, dry and intact. No rash noted. Psychiatric: Mood and affect are normal. Speech and behavior are normal.  ____________________________________________   LABS (all labs ordered are listed, but only abnormal results are displayed)  Labs Reviewed  BASIC METABOLIC PANEL - Abnormal; Notable for the following:    Glucose, Bld 148 (*)    BUN 21 (*)    Anion gap 4 (*)    All other components within normal limits  CBC - Abnormal; Notable for the following:    RBC 4.13 (*)    HCT 38.5 (*)    Platelets 149 (*)    All other components within normal limits  URINALYSIS COMPLETEWITH MICROSCOPIC ([facility] ONLY) - Abnormal; Notable for the following:    Color, Urine AMBER (*)    APPearance CLEAR (*)    Glucose, UA 150 (*)    Ketones, ur TRACE (*)    Squamous Epithelial / LPF 0-5 (*)    All other components within normal limits  TROPONIN I  CBG MONITORING, ED   ____________________________________________  EKG  ED ECG REPORT I, Nita Sicklearolina Lealand Elting, the attending physician, personally viewed and interpreted this ECG.  Sinus rhythm, first-degree AV block, normal QRS and QTc intervals, normal axis, ST depressions on inferior leads, no STE occasional PVCs.  ST depressions new from EKG from 2014 ____________________________________________  RADIOLOGY  CT head and c-spine: negative  XR wrist: negative  ____________________________________________   PROCEDURES  Procedure(s) performed:yes  LACERATION REPAIR Performed by: Nita Sicklearolina Aariyah Sampey Authorized by: Nita Sicklearolina Kerianna Rawlinson Consent: Verbal consent obtained. Risks and benefits: risks, benefits and alternatives were  discussed Consent given by: patient Patient identity confirmed: provided demographic data Prepped and Draped in normal sterile fashion Wound explored  Laceration Location: scalp  Laceration Length: 2.0cm  No Foreign Bodies seen or palpated  Anesthesia: local infiltration  Local anesthetic: none  Irrigation method: syringe Amount of cleaning: standard  Skin closure: staples  Number of sutures: 2  Technique: simple interrupted  Patient tolerance: Patient tolerated the procedure well with no immediate complications.  LACERATION REPAIR Performed by: Nita Sicklearolina Elery Cadenhead Authorized by: Nita Sicklearolina Darwin Guastella Consent: Verbal consent obtained. Risks and benefits: risks, benefits and alternatives were discussed Consent given by: patient Patient identity confirmed: provided demographic data Prepped and Draped in normal sterile fashion Wound explored  Laceration Location: R wrist  Laceration Length: 4.0cm  No Foreign Bodies seen or palpated  Irrigation method: syringe Amount of cleaning: standard  Skin closure: steri strips  Number of sutures: none  Technique: none  Patient tolerance: Patient tolerated the procedure well with no immediate complications.    Critical Care performed:  None ____________________________________________   INITIAL IMPRESSION / ASSESSMENT AND PLAN / ED COURSE  82 y.o. male the history of A. fib not on any anticoagulants, hypertension, hyperlipidemia, native vessels CAD, and DM who presents for evaluation of syncope with no preceding symptoms concerning for possible cardiac syncope. Patient with scalp laceration and bruising of his right ear, no signs of basilar skull fracture on exam, swelling of the R wrist. No other findings on exam. Plan for telemetry, labs, EKG, CXR, writs XR, CT head and C-spine, laceration repair, and admission to the hospital.  ----------------------------------------- 10:47 PM on  10/17/2015 -----------------------------------------  Negative for head CT and see cervical spine. Negative x-rays. Laceration repaired per procedure above. We'll discuss with the hospitalist for admission for syncope.   Pertinent labs & imaging results that were available during my care of the patient were reviewed by me and considered in my medical decision making (see chart for details).    ____________________________________________   FINAL CLINICAL IMPRESSION(S) / ED DIAGNOSES  Final diagnoses:  Syncope, unspecified syncope type  Scalp laceration, initial encounter  Skin abrasion      NEW MEDICATIONS STARTED DURING THIS VISIT:  New Prescriptions   No medications on file     Note:  This document was prepared using Dragon voice recognition software and may include unintentional dictation errors.    Nita Sickle, MD 10/17/15 2326

## 2015-10-18 ENCOUNTER — Encounter: Payer: Self-pay | Admitting: Internal Medicine

## 2015-10-18 ENCOUNTER — Observation Stay: Payer: Medicare Other

## 2015-10-18 ENCOUNTER — Observation Stay
Admit: 2015-10-18 | Discharge: 2015-10-18 | Disposition: A | Discharge status: Home / Self Care | Payer: Medicare Other | Attending: Internal Medicine | Admitting: Internal Medicine

## 2015-10-18 DIAGNOSIS — R55 Syncope and collapse: Secondary | ICD-10-CM | POA: Diagnosis present

## 2015-10-18 LAB — BASIC METABOLIC PANEL
Anion gap: 5 (ref 5–15)
BUN: 20 mg/dL (ref 6–20)
CALCIUM: 9.2 mg/dL (ref 8.9–10.3)
CHLORIDE: 109 mmol/L (ref 101–111)
CO2: 29 mmol/L (ref 22–32)
CREATININE: 0.83 mg/dL (ref 0.61–1.24)
GFR calc non Af Amer: >60 mL/min (ref >60)
GLUCOSE: 187 mg/dL — AB (ref 65–99)
Potassium: 4.2 mmol/L (ref 3.5–5.1)
Sodium: 143 mmol/L (ref 135–145)

## 2015-10-18 LAB — GLUCOSE, CAPILLARY
GLUCOSE-CAPILLARY: 219 mg/dL — AB (ref 65–99)
Glucose-Capillary: 115 mg/dL — ABNORMAL HIGH (ref 65–99)
Glucose-Capillary: 114 mg/dL — ABNORMAL HIGH (ref 65–99)

## 2015-10-18 LAB — CBC
HEMATOCRIT: 38 % — AB (ref 40.0–52.0)
Hemoglobin: 13.1 g/dL (ref 13.0–18.0)
MCH: 32.4 pg (ref 26.0–34.0)
MCHC: 34.5 g/dL (ref 32.0–36.0)
MCV: 93.8 fL (ref 80.0–100.0)
Platelets: 135 10*3/uL — ABNORMAL LOW (ref 150–440)
RBC: 4.06 MIL/uL — ABNORMAL LOW (ref 4.40–5.90)
RDW: 14.1 % (ref 11.5–14.5)
WBC: 10.9 10*3/uL — ABNORMAL HIGH (ref 3.8–10.6)

## 2015-10-18 LAB — ECHOCARDIOGRAM COMPLETE
Height: 68 in
WEIGHTICAEL: 2528 [oz_av]

## 2015-10-18 LAB — TROPONIN I
Troponin I: <0.03 ng/mL (ref <0.03)
Troponin I: <0.03 ng/mL (ref <0.03)
Troponin I: <0.03 ng/mL (ref <0.03)

## 2015-10-18 MED ORDER — SODIUM CHLORIDE 0.9% FLUSH
3.0000 mL | Freq: Two times a day (BID) | INTRAVENOUS | Status: DC
  Administered 2015-10-18: 3 mL via INTRAVENOUS

## 2015-10-18 MED ORDER — INSULIN ASPART 100 UNIT/ML ~~LOC~~ SOLN: 0.0000 [IU] | Freq: Every day | SUBCUTANEOUS | Status: DC

## 2015-10-18 MED ORDER — SODIUM CHLORIDE 0.9 % IV SOLN: INTRAVENOUS | Status: DC

## 2015-10-18 MED ORDER — SODIUM CHLORIDE 0.9% FLUSH: 3.0000 mL | INTRAVENOUS | Status: DC | PRN

## 2015-10-18 MED ORDER — INSULIN ASPART 100 UNIT/ML ~~LOC~~ SOLN: 0.0000 [IU] | Freq: Three times a day (TID) | SUBCUTANEOUS | Status: DC

## 2015-10-18 MED ORDER — SODIUM CHLORIDE 0.9 % IV SOLN: 250.0000 mL | INTRAVENOUS | Status: DC | PRN

## 2015-10-18 MED ORDER — SENNOSIDES-DOCUSATE SODIUM 8.6-50 MG PO TABS: 1.0000 | ORAL_TABLET | Freq: Every evening | ORAL | Status: DC | PRN

## 2015-10-18 MED ORDER — METOPROLOL TARTRATE 50 MG PO TABS
50.0000 mg | ORAL_TABLET | Freq: Every day | ORAL | Status: DC
  Administered 2015-10-18: 50 mg via ORAL
  Filled 2015-10-18: qty 1

## 2015-10-18 MED ORDER — ONDANSETRON HCL 4 MG PO TABS: 4.0000 mg | ORAL_TABLET | Freq: Four times a day (QID) | ORAL | Status: DC | PRN

## 2015-10-18 MED ORDER — ASPIRIN EC 81 MG PO TBEC
81.0000 mg | DELAYED_RELEASE_TABLET | Freq: Every day | ORAL | Status: DC
  Administered 2015-10-18: 81 mg via ORAL
  Filled 2015-10-18: qty 1

## 2015-10-18 MED ORDER — ONDANSETRON HCL 4 MG/2ML IJ SOLN: 4.0000 mg | Freq: Four times a day (QID) | INTRAMUSCULAR | Status: DC | PRN

## 2015-10-18 MED ORDER — HYDROCODONE-ACETAMINOPHEN 5-325 MG PO TABS
1.0000 | ORAL_TABLET | ORAL | Status: DC | PRN
  Administered 2015-10-18 (×3): 1 via ORAL
  Filled 2015-10-18 (×3): qty 1

## 2015-10-18 MED ORDER — ACETAMINOPHEN 325 MG PO TABS: 650.0000 mg | ORAL_TABLET | Freq: Four times a day (QID) | ORAL | Status: DC | PRN

## 2015-10-18 MED ORDER — ACETAMINOPHEN 650 MG RE SUPP
650.0000 mg | Freq: Four times a day (QID) | RECTAL | Status: DC | PRN
Start: 2015-10-18 — End: 2015-10-18

## 2015-10-18 MED ORDER — ENOXAPARIN SODIUM 40 MG/0.4ML ~~LOC~~ SOLN: 40.0000 mg | Freq: Every day | SUBCUTANEOUS | Status: DC

## 2015-10-18 NOTE — Progress Notes (Signed)
Patient is discharge home in  A stable condition, summary and f/u care given verbalized understanding , left with daughter

## 2015-10-18 NOTE — Discharge Summary (Signed)
[facility] [place] [facility]   PATIENT NAME: Rodney Navarro    MR#:  [id number]  DATE OF BIRTH:  06-Apr-1924  DATE OF ADMISSION:  10/17/2015 ADMITTING PHYSICIAN: Ihor AustinPavan Pyreddy, MD  DATE OF DISCHARGE: 10/18/2015  PRIMARY CARE PHYSICIAN: Danella PentonMark F Miller, MD    ADMISSION DIAGNOSIS:  Syncope and collapse [R55] Skin abrasion [T14.8] Scalp laceration, initial encounter [S01.01XA] Syncope, unspecified syncope type [R55]  DISCHARGE DIAGNOSIS:  Active Problems:   Syncope Dehydration  SECONDARY DIAGNOSIS:   Past Medical History  Diagnosis Date  . Hyperlipidemia   . Hypertension   . A-fib (HCC)   . Osteoporosis   . Diabetes mellitus without complication [facility](HCC)     HOSPITAL COURSE:  82 year old male patient with history of hypertension, hyperlipidemia, osteoporosis, type 2 diabetes mellitus presented to the emergency room with syncope. Admitting diagnosis : 1. Syncope, unclear etiology.  Carotid US: Moderate to large amount of bilateral atherosclerotic plaque, not resulting in a hemodynamically significant stenosis within either internal carotid artery. Echo is pending.  On Aspirin 81 mg daily. Per Dr. Darrold JunkerParaschos, f/u him as outpatient this coming week.  2. Dehydration, treated with IVF, improved. 3. Hypertension. Continue HTN meds. 4. 2 diabetes mellitus. On sliding scale.  I discussed with Dr. Darrold JunkerParaschos. DISCHARGE CONDITIONS:   Stable, discharge to home today.  CONSULTS OBTAINED:  Treatment Team:  Marcina MillardAlexander Paraschos, MD  DRUG ALLERGIES:   Allergies  Allergen Reactions  . Alendronate Other (See Comments)  . Risedronate Other (See Comments)  . Tetanus Toxoids     DISCHARGE MEDICATIONS:   Current Discharge Medication List    CONTINUE these medications which have NOT CHANGED   Details  aspirin (GOODSENSE ASPIRIN) 81 MG chewable tablet Chew 81 mg by mouth daily.    Cholecalciferol (VITAMIN D) 2000 units tablet Take 2,000 Units by  mouth daily.    clotrimazole-betamethasone (LOTRISONE) cream Apply 1 application topically daily. Apply to genital region    Cyanocobalamin (RA VITAMIN B-12 TR) 1000 MCG TBCR Take 1,000 mcg by mouth daily.    fluticasone (FLONASE) 50 MCG/ACT nasal spray Place 2 sprays into both nostrils daily.    metoprolol succinate (TOPROL-XL) 50 MG 24 hr tablet Take 50 mg by mouth daily.    Multiple Vitamin (MULTI-VITAMINS) TABS Take 1 tablet by mouth daily.    hydrochlorothiazide (MICROZIDE) 12.5 MG capsule Take 12.5 mg by mouth daily. Reported on 10/18/2015         DISCHARGE INSTRUCTIONS:    DIET:  Heart healthy and ADA diet.  DISCHARGE CONDITION:  Stable.  ACTIVITY:  As tolerated.}  DISCHARGE LOCATION:    If you experience worsening of your admission symptoms, develop shortness of breath, life threatening emergency, suicidal or homicidal thoughts you must seek medical attention immediately by calling 911 or calling your MD immediately  if symptoms less severe.  You Must read complete instructions/literature along with all the possible adverse reactions/side effects for all the Medicines you take and that have been prescribed to you. Take any new Medicines after you have completely understood and accpet all the possible adverse reactions/side effects.   Please note  You were cared for by a hospitalist during your hospital stay. If you have any questions about your discharge medications or the care you received while you were in the hospital after you are discharged, you can call the unit and asked to speak with the hospitalist on call if the hospitalist that took care of you is not available. Once you  are discharged, your primary care physician will handle any further medical issues. Please note that NO REFILLS for any discharge medications will be authorized once you are discharged, as it is imperative that you return to your primary care physician (or establish a relationship with a  primary care physician if you do not have one) for your aftercare needs so that they can reassess your need for medications and monitor your lab values.    On the day of Discharge:  VITAL SIGNS:  Blood pressure 113/78, pulse 57, temperature 97.8 F (36.6 C), temperature source Oral, resp. rate 18, height  (1.727 m), weight 158 lb (71.668 kg), SpO2 98 %.  PHYSICAL EXAMINATION:  GENERAL:  82 y.o.-year-old patient lying in the bed with no acute distress.  EYES: Pupils equal, round, reactive to light and accommodation. No scleral icterus. Extraocular muscles intact.  HEENT: Head atraumatic, normocephalic. Oropharynx and nasopharynx clear.  NECK:  Supple, no jugular venous distention. No thyroid enlargement, no tenderness.  LUNGS: Normal breath sounds bilaterally, no wheezing, rales,rhonchi or crepitation. No use of accessory muscles of respiration.  CARDIOVASCULAR: S1, S2 normal. No murmurs, rubs, or gallops.  ABDOMEN: Soft, non-tender, non-distended. Bowel sounds present. No organomegaly or mass.  EXTREMITIES: No pedal edema, cyanosis, or clubbing.  NEUROLOGIC: Cranial nerves II through XII are intact. Muscle strength 5/5 in all extremities. Sensation intact. Gait not checked.  PSYCHIATRIC: The patient is alert and oriented x 3.  SKIN: No obvious rash, lesion, or ulcer.  DATA REVIEW:   CBC  Recent Labs Lab 10/18/15 0231  WBC 10.9*  HGB 13.1  HCT 38.0*  PLT 135*    Chemistries   Recent Labs Lab 10/18/15 0231  NA 143  K 4.2  CL 109  CO2 29  GLUCOSE 187*  BUN 20  CREATININE 0.83  CALCIUM 9.2    Cardiac Enzymes  Recent Labs Lab 10/18/15 0800  TROPONINI <0.03    Microbiology Results  No results found for this or any previous visit.  RADIOLOGY:  Dg Chest 2 View  10/17/2015  CLINICAL DATA:  Fall and syncope EXAM: CHEST  2 VIEW COMPARISON:  12/04/2012 FINDINGS: There is chronic blunting of the lateral left costophrenic sulcus from scar. There is no edema,  consolidation, effusion, or pneumothorax. Calcified pulmonary granulomas larger on the right. Normal heart size mediastinal contours. No visible fracture. IMPRESSION: No acute finding. Electronically Signed   By: Marnee Spring M.D.   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 No malalignment or acute fracture. No soft tissue abnormality. Distal radius, ulna, and carpal bones appear intact. IMPRESSION: No acute osseous finding. Advanced arthritic changes of the right first [facility] joint. Electronically Signed   By: Judie Petit.  Shick M.D.   On: 10/17/2015 22:25   Ct Head Wo Contrast  10/17/2015  CLINICAL DATA:  Fall with head injury.  Initial encounter. EXAM: CT HEAD WITHOUT CONTRAST CT CERVICAL SPINE WITHOUT CONTRAST TECHNIQUE: Multidetector CT imaging of the head and cervical spine was performed following the standard protocol without intravenous contrast. Multiplanar CT image reconstructions of the cervical spine were also generated. COMPARISON:  None. FINDINGS: CT HEAD FINDINGS Brain: No evidence of acute infarction, hemorrhage, hydrocephalus, or mass lesion/mass effect. Age congruent volume loss and periventricular low-density. Vascular: No hyperdense vessel  or unexpected calcification. Skull: Negative for fracture or focal lesion. Sinuses/Orbits: Chronic sinusitis with diffuse mucosal thickening. Polypoid structures in the bilateral nasal cavity, larger on the right. No aggressive changes. Retained secretions in the right sphenoid sinus. Bilateral cataract resection. Other: None. CT CERVICAL SPINE FINDINGS Alignment: No traumatic malalignment. Mild levo curvature the cervical spine could be positional or due to asymmetric facet degeneration on the right. Mild facet mediated C3-4,  C4-5, and C5-6 anterolisthesis Skull base and vertebrae: No  acute fracture. No aggressive process. Soft tissues and canal: No prevertebral fluid. No gross canal hematoma. Degenerative: Asymmetric bulky degenerative facet hypertrophy on the right. IMPRESSION: 1. No evidence of acute intracranial or cervical spine injury. 2. Chronic sinusitis with nasal polyps. Electronically Signed   By: Marnee Spring M.D.   On: 10/17/2015 22:09   Ct Cervical Spine Wo Contrast  10/17/2015  CLINICAL DATA:  Fall with head injury.  Initial encounter. EXAM: CT HEAD WITHOUT CONTRAST CT CERVICAL SPINE WITHOUT CONTRAST TECHNIQUE: Multidetector CT imaging of the head and cervical spine was performed following the standard protocol without intravenous contrast. Multiplanar CT image reconstructions of the cervical spine were also generated. COMPARISON:  None. FINDINGS: CT HEAD FINDINGS Brain: No evidence of acute infarction, hemorrhage, hydrocephalus, or mass lesion/mass effect. Age congruent volume loss and periventricular low-density. Vascular: No hyperdense vessel or unexpected calcification. Skull: Negative for fracture or focal lesion. Sinuses/Orbits: Chronic sinusitis with diffuse mucosal thickening. Polypoid structures in the bilateral nasal cavity, larger on the right. No aggressive changes. Retained secretions in the right sphenoid sinus. Bilateral cataract resection. Other: None. CT CERVICAL SPINE FINDINGS Alignment: No traumatic malalignment. Mild levo curvature the cervical spine could be positional or due to asymmetric facet degeneration on the right. Mild facet mediated C3-4, C4-5, and C5-6 anterolisthesis Skull base and vertebrae: No  acute fracture. No aggressive process. Soft tissues and canal: No prevertebral fluid. No gross canal hematoma. Degenerative: Asymmetric bulky degenerative facet hypertrophy on the right. IMPRESSION: 1. No evidence of acute intracranial or cervical spine injury. 2. Chronic sinusitis with nasal  polyps. Electronically Signed   By: Marnee Spring M.D.   On: 10/17/2015 22:09   US Carotid Bilateral  10/18/2015  CLINICAL DATA:  History of hypertension and syncope. History of heart arrhythmia. EXAM: BILATERAL CAROTID DUPLEX ULTRASOUND TECHNIQUE: Wallace Cullens scale imaging, color Doppler and duplex ultrasound were performed of bilateral carotid and vertebral arteries in the neck. COMPARISON:  Head CT - 10/17/2015 FINDINGS: Criteria: Quantification of carotid stenosis is based on velocity parameters that correlate the residual internal carotid diameter with NASCET-based stenosis levels, using the diameter of the distal internal carotid lumen as the denominator for stenosis measurement. The following velocity measurements were obtained: RIGHT ICA:  83/14 cm/sec CCA:  59/6 cm/sec SYSTOLIC ICA/CCA RATIO:  1.4 DIASTOLIC ICA/CCA RATIO:  2.4 ECA:  86 cm/sec LEFT ICA:  76/20 cm/sec CCA:  70/12 cm/sec SYSTOLIC ICA/CCA RATIO:  1.1 DIASTOLIC ICA/CCA RATIO:  1.7 ECA:  72 cm/sec RIGHT CAROTID ARTERY: There is a moderate to large amount of eccentric mixed echogenic partially shadowing plaque within the right carotid bulb (image 12), extending to involve the origin and proximals aspect the right internal carotid artery (image 21), not resulting in elevated peak systolic velocities within the interrogated course the right internal carotid artery to suggest a hemodynamically significant stenosis. RIGHT VERTEBRAL ARTERY:  Antegrade flow LEFT CAROTID ARTERY: There is a minimal amount of eccentric mixed echogenic plaque scattered within the mid and distal aspects of the left  common carotid artery (image 38 and 44). There is a moderate to large amount of eccentric mixed echogenic densely shadowing plaque involving the left carotid bulb (image 47 and 48), extending to involve the origin and proximal aspects of the left internal carotid artery (image 56). LEFT VERTEBRAL ARTERY:  Antegrade flow IMPRESSION: Moderate to large amount of  bilateral atherosclerotic plaque, not resulting in a hemodynamically significant stenosis within either internal carotid artery. Electronically Signed   By: Simonne Come M.D.   On: 10/18/2015 11:13     Management plans discussed with the patient, family and they are in agreement.  CODE STATUS:     Code Status Orders        Start     Ordered   10/18/15 0147  Full code   Continuous     10/18/15 0146    Code Status History    Date Active Date Inactive Code Status Order ID Comments User Context   This patient has a current code status but no historical code status.      TOTAL TIME TAKING CARE OF THIS PATIENT: 25 minutes.    Shaune Pollack M.D on 10/18/2015 at 1:37 PM  Between 7am to 6pm - Pager - [phone]  After 6pm go to [email or web address] - password [email or web address]  [facility]  Office  [phone]  CC: Primary care physician; Danella Penton, MD   Note: This dictation was prepared with Dragon dictation along with smaller phrase technology. Any transcriptional errors that result from this process are unintentional.

## 2015-10-18 NOTE — Progress Notes (Signed)
Inpatient Diabetes Program Recommendations  AACE/ADA: New Consensus Statement on Inpatient Glycemic Control (2015)  Target Ranges:  Prepandial:   less than 140 mg/dL      Peak postprandial:   less than 180 mg/dL (1-2 hours)      Critically ill patients:  140 - 180 mg/dL   Lab Results  Component Value Date   GLUCAP 114* 10/18/2015    Review of Glycemic Control  Results for Salvatore DecentSYKES, Derrill L (MRN [id number]) as of 10/18/2015 12:36  Ref. Range 10/18/2015 02:21 10/18/2015 07:51 10/18/2015 11:50  Glucose-Capillary Latest Ref Range: 65-99 mg/dL 409219 (H) 811115 (H) 914114 (H)    Diabetes history: Type 2 Outpatient Diabetes medications: none Current orders for Inpatient glycemic control: Novolog 0-9 units tid, Novolog 0-5 units qhs  Inpatient Diabetes Program Recommendations:  Agree with current orders for blood sugar management  Susette RacerJulie Panfilo Ketchum, RN, [place], [place]HA, CDE Diabetes Coordinator Inpatient Diabetes Program  [phone] (Team Pager) [phone] [facility] Office) 10/18/2015 12:37 PM

## 2015-10-18 NOTE — Care Management Obs Status (Signed)
MEDICARE OBSERVATION STATUS NOTIFICATION   Patient Details  Name: Rodney Navarro MRN: [id number] Date of Birth: 22-Sep-1924   Medicare Observation Status Notification Given:  Yes    Rodney MemosLisa M Aubria Vanecek, RN 10/18/2015, 10:06 AM

## 2015-10-18 NOTE — H&P (Signed)
[facility] - [place] at [facility]   PATIENT NAME: Rodney Navarro    MR#:  [id number]  DATE OF BIRTH:  September 22, 1924  DATE OF ADMISSION:  10/17/2015  PRIMARY CARE PHYSICIAN: Danella Penton, MD   REQUESTING/REFERRING PHYSICIAN:   CHIEF COMPLAINT:   Chief Complaint  Patient presents with  . Loss of Consciousness    HISTORY OF PRESENT ILLNESS: Rodney Navarro  is a 82 y.o. male with a known history of hypertension, hyperlipidemia, atrial fibrillation, osteoporosis, diabetes mellitus presented to the emergency room after he passed out. Patient passed out around 4 PM yesterday evening at home. He lost consciousness and when he fell down he he hit his head to the ground. He sustained a laceration over the scalp which was stapled in the emergency room. No complaints of any chest pain. No complaints of shortness of breath or orthopnea. Patient felt dizzy since yesterday morning and off late has been having trouble with his balance. EKG in the emergency room did not show any ST segment changes and first set of troponin was negative. Hospitalist service was consulted for further care of the patient. Patient was worked up with CT head which showed no acute intracranial abnormality. CT spine did not show any fracture.  PAST MEDICAL HISTORY:   Past Medical History  Diagnosis Date  . Hyperlipidemia   . Hypertension   . A-fib (HCC)   . Osteoporosis   . Diabetes mellitus without complication (HCC)     PAST SURGICAL HISTORY: Past Surgical History  Procedure Laterality Date  . None      SOCIAL HISTORY:  Social History  Substance Use Topics  . Smoking status: Never Smoker   . Smokeless tobacco: Not on file  . Alcohol Use: No    FAMILY HISTORY:  Family History  Problem Relation Age of Onset  . Heart disease Father     DRUG ALLERGIES:  Allergies  Allergen Reactions  . Alendronate Other (See Comments)  . Risedronate Other (See Comments)  . Tetanus Toxoids     REVIEW  OF SYSTEMS:   CONSTITUTIONAL: No fever, has  weakness.  EYES: No blurred or double vision.  EARS, NOSE, AND THROAT: No tinnitus or ear pain.  RESPIRATORY: No cough, shortness of breath, wheezing or hemoptysis.  CARDIOVASCULAR: No chest pain, orthopnea, edema.  GASTROINTESTINAL: No nausea, vomiting, diarrhea or abdominal pain.  GENITOURINARY: No dysuria, hematuria.  ENDOCRINE: No polyuria, nocturia,  HEMATOLOGY: No anemia, easy bruising or bleeding SKIN: No rash or lesion. MUSCULOSKELETAL: No joint pain or arthritis.   NEUROLOGIC: No tingling, numbness, weakness. Had passed out. Has dizziness. PSYCHIATRY: No anxiety or depression.   MEDICATIONS AT HOME:  Prior to Admission medications   Medication Sig Start Date End Date Taking? Authorizing Provider  aspirin (GOODSENSE ASPIRIN) 81 MG chewable tablet Chew 81 mg by mouth daily.   Yes Historical Provider, MD  Cholecalciferol (VITAMIN D) 2000 units tablet Take 2,000 Units by mouth daily. 06/20/14  Yes Historical Provider, MD  clotrimazole-betamethasone (LOTRISONE) cream Apply 1 application topically daily. Apply to genital region 07/05/15  Yes Historical Provider, MD  Cyanocobalamin (RA VITAMIN B-12 TR) 1000 MCG TBCR Take 1,000 mcg by mouth daily. 06/20/14  Yes Historical Provider, MD  fluticasone (FLONASE) 50 MCG/ACT nasal spray Place 2 sprays into both nostrils daily. 11/05/14  Yes Historical Provider, MD  metoprolol succinate (TOPROL-XL) 50 MG 24 hr tablet Take 50 mg by mouth daily. 06/20/14  Yes Historical Provider, MD  Multiple Vitamin (MULTI-VITAMINS) TABS  Take 1 tablet by mouth daily.   Yes Historical Provider, MD  hydrochlorothiazide (MICROZIDE) 12.5 MG capsule Take 12.5 mg by mouth daily. Reported on 10/18/2015 10/03/15   Historical Provider, MD      PHYSICAL EXAMINATION:   VITAL SIGNS: Blood pressure 150/97, pulse 67, temperature 98.2 F (36.8 C), temperature source Oral, resp. rate 23, height 5\' 8"  (1.727 m), weight 71.668 kg (158 lb),  SpO2 98 %.  GENERAL:  82 y.o.-year-old elderly patient lying in the bed with no acute distress.  EYES: Pupils equal, round, reactive to light and accommodation. No scleral icterus. Extraocular muscles intact.  HEENT: Head atraumatic, normocephalic. Oropharynx dry and nasopharynx clear.  NECK:  Supple, no jugular venous distention. No thyroid enlargement, no tenderness.  LUNGS: Normal breath sounds bilaterally, no wheezing, rales,rhonchi or crepitation. No use of accessory muscles of respiration.  CARDIOVASCULAR: S1, S2 normal. No murmurs, rubs, or gallops.  ABDOMEN: Soft, nontender, nondistended. Bowel sounds present. No organomegaly or mass.  EXTREMITIES: No pedal edema, cyanosis, or clubbing.  NEUROLOGIC: Cranial nerves II through XII are intact. Muscle strength 5/5 in all extremities. Sensation intact. Gait not checked.  PSYCHIATRIC: The patient is alert and oriented x 3.  SKIN: No obvious rash, lesion, or ulcer.   LABORATORY PANEL:   CBC  Recent Labs Lab 10/17/15 1807  WBC 9.7  HGB 13.5  HCT 38.5*  PLT 149*  MCV 93.1  MCH 32.6  MCHC 35.1  RDW 14.2   ------------------------------------------------------------------------------------------------------------------  Chemistries   Recent Labs Lab 10/17/15 1807  NA 141  K 3.8  CL 108  CO2 29  GLUCOSE 148*  BUN 21*  CREATININE 0.78  CALCIUM 9.6   ------------------------------------------------------------------------------------------------------------------ estimated creatinine clearance is 61.8 mL/min (by C-G formula based on Cr of 0.78). ------------------------------------------------------------------------------------------------------------------ No results for input(s): TSH, T4TOTAL, T3FREE, THYROIDAB in the last 72 hours.  Invalid input(s): FREET3   Coagulation profile No results for input(s): INR, PROTIME in the last 168  hours. ------------------------------------------------------------------------------------------------------------------- No results for input(s): DDIMER in the last 72 hours. -------------------------------------------------------------------------------------------------------------------  Cardiac Enzymes  Recent Labs Lab 10/17/15 1807  TROPONINI <0.03   ------------------------------------------------------------------------------------------------------------------ Invalid input(s): POCBNP  ---------------------------------------------------------------------------------------------------------------  Urinalysis    Component Value Date/Time   COLORURINE AMBER* 10/17/2015 1807   COLORURINE Yellow 12/04/2012 0916   APPEARANCEUR CLEAR* 10/17/2015 1807   APPEARANCEUR Clear 12/04/2012 0916   LABSPEC 1.028 10/17/2015 1807   LABSPEC 1.014 12/04/2012 0916   PHURINE 5.0 10/17/2015 1807   PHURINE 6.0 12/04/2012 0916   GLUCOSEU 150* 10/17/2015 1807   GLUCOSEU 50 mg/dL 40/98/1191 4782   HGBUR NEGATIVE 10/17/2015 1807   HGBUR Negative 12/04/2012 0916   BILIRUBINUR NEGATIVE 10/17/2015 1807   BILIRUBINUR Negative 12/04/2012 0916   KETONESUR TRACE* 10/17/2015 1807   KETONESUR Trace 12/04/2012 0916   PROTEINUR NEGATIVE 10/17/2015 1807   PROTEINUR Negative 12/04/2012 0916   NITRITE NEGATIVE 10/17/2015 1807   NITRITE Negative 12/04/2012 0916   LEUKOCYTESUR NEGATIVE 10/17/2015 1807   LEUKOCYTESUR Negative 12/04/2012 0916     RADIOLOGY: Dg Chest 2 View  10/17/2015  CLINICAL DATA:  Fall and syncope EXAM: CHEST  2 VIEW COMPARISON:  12/04/2012 FINDINGS: There is chronic blunting of the lateral left costophrenic sulcus from scar. There is no edema, consolidation, effusion, or pneumothorax. Calcified pulmonary granulomas larger on the right. Normal heart size mediastinal contours. No visible fracture. IMPRESSION: No acute finding. Electronically Signed   By: Marnee Spring M.D.   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 No malalignment or acute fracture. No soft tissue abnormality. Distal radius, ulna, and carpal bones appear intact. IMPRESSION: No acute osseous finding. Advanced arthritic changes of the right first CMC joint. Electronically Signed   By: Judie Petit.  Shick M.D.   On: 10/17/2015 22:25   Ct Head Wo Contrast  10/17/2015  CLINICAL DATA:  Fall with head injury.  Initial encounter. EXAM: CT HEAD WITHOUT CONTRAST CT CERVICAL SPINE WITHOUT CONTRAST TECHNIQUE: Multidetector CT imaging of the head and cervical spine was performed following the standard protocol without intravenous contrast. Multiplanar CT image reconstructions of the cervical spine were also generated. COMPARISON:  None. FINDINGS: CT HEAD FINDINGS Brain: No evidence of acute infarction, hemorrhage, hydrocephalus, or mass lesion/mass effect. Age congruent volume loss and periventricular low-density. Vascular: No hyperdense vessel or unexpected calcification. Skull: Negative for fracture or focal lesion. Sinuses/Orbits: Chronic sinusitis with diffuse mucosal thickening. Polypoid structures in the bilateral nasal cavity, larger on the right. No aggressive changes. Retained secretions in the right sphenoid sinus. Bilateral cataract resection. Other: None. CT CERVICAL SPINE FINDINGS Alignment: No traumatic malalignment. Mild levo curvature the cervical spine could be positional or due to asymmetric facet degeneration on the right. Mild facet mediated C3-4, C4-5, and C5-6 anterolisthesis Skull base and vertebrae: No  acute fracture. No aggressive process. Soft tissues and canal: No prevertebral fluid. No gross canal hematoma. Degenerative: Asymmetric bulky degenerative facet hypertrophy on the  right. IMPRESSION: 1. No evidence of acute intracranial or cervical spine injury. 2. Chronic sinusitis with nasal polyps. Electronically Signed   By: Marnee Spring M.D.   On: 10/17/2015 22:09   Ct Cervical Spine Wo Contrast  10/17/2015  CLINICAL DATA:  Fall with head injury.  Initial encounter. EXAM: CT HEAD WITHOUT CONTRAST CT CERVICAL SPINE WITHOUT CONTRAST TECHNIQUE: Multidetector CT imaging of the head and cervical spine was performed following the standard protocol without intravenous contrast. Multiplanar CT image reconstructions of the cervical spine were also generated. COMPARISON:  None. FINDINGS: CT HEAD FINDINGS Brain: No evidence of acute infarction, hemorrhage, hydrocephalus, or mass lesion/mass effect. Age congruent volume loss and periventricular low-density. Vascular: No hyperdense vessel or unexpected calcification. Skull: Negative for fracture or focal lesion. Sinuses/Orbits: Chronic sinusitis with diffuse mucosal thickening. Polypoid structures in the bilateral nasal cavity, larger on the right. No aggressive changes. Retained secretions in the right sphenoid sinus. Bilateral cataract resection. Other: None. CT CERVICAL SPINE FINDINGS Alignment: No traumatic malalignment. Mild levo curvature the cervical spine could be positional or due to asymmetric facet degeneration on the right. Mild facet mediated C3-4, C4-5, and C5-6 anterolisthesis Skull base and vertebrae: No  acute fracture. No aggressive process. Soft tissues and canal: No prevertebral fluid. No gross canal hematoma. Degenerative: Asymmetric bulky degenerative facet hypertrophy on the right. IMPRESSION: 1. No evidence of acute intracranial or cervical spine injury. 2. Chronic sinusitis with nasal polyps. Electronically Signed   By: Marnee Spring M.D.   On: 10/17/2015 22:09    EKG: Orders placed or performed during the hospital encounter of 10/17/15  . ED EKG  . ED EKG  . EKG 12-Lead  . EKG 12-Lead    IMPRESSION AND  PLAN: 82 year old male patient with history of hypertension, hyperlipidemia, osteoporosis, type 2 diabetes mellitus presented to the emergency room with syncope. Admitting diagnosis : 1. Syncope and  collapse 2. Dehydration 3. Hypertension 4. 2 diabetes mellitus Treatment plan : Admit patient to telemetry observation bed Aspirin 81 mg daily Check troponin to rule out ischemia Check echocardiogram to assess LV function Cardiology consultation DVT prophylaxis subcutaneous Lovenox 40 MG daily IV fluid hydration  All the records are reviewed and case discussed with ED provider. Management plans discussed with the patient, family and they are in agreement.  CODE STATUS:FULL Code Status History    This patient does not have a recorded code status. Please follow your organizational policy for patients in this situation.       TOTAL TIME TAKING CARE OF THIS PATIENT: 51 minutes.    Ihor AustinPavan Pyreddy M.D on 10/18/2015 at 1:03 AM  Between 7am to 6pm - Pager - [phone]  After 6pm go to [email or web address] - password [email or web address]  [facility]  Office  [phone]  CC: Primary care physician; Danella PentonMark F Miller, MD

## 2015-10-18 NOTE — Consult Note (Signed)
[facility]  CARDIOLOGY CONSULT NOTE  Patient ID: Rodney Navarro MRN: [id number] DOB/AGE: 09-14-1924 82 y.o.  Admit date: 10/17/2015 Referring Physician Imogene Burnhen Primary Physician [facility] Primary Cardiologist Francisca Langenderfer Reason for Consultation Syncope  HPI: 82 year old male referred for evaluation of syncope. The patient has a history of paroxysmal atrial fibrillation and coronary artery disease. Yesterday, the patient was walking up a ramp, experienced sudden and brief episode of loss of consciousness, fell and hit his head with resulting laceration. The patient reports he feels somewhat dizzy at baseline, unsteady at times walking. He denies chest pain, shortness of breath, palpitations, heart racing or peripheral edema. ECG revealed normal sinus rhythm. The patient has remained in normal sinus use them on telemetry. He's had no recurrent presyncope or syncope. He has ruled out for myocardial infarction with negative troponin.  Review of systems complete and found to be negative unless listed above     Past Medical History  Diagnosis Date  . Hyperlipidemia   . Hypertension   . A-fib (HCC)   . Osteoporosis   . Diabetes mellitus without complication [facility](HCC)     Past Surgical History  Procedure Laterality Date  . None      Prescriptions prior to admission  Medication Sig Dispense Refill Last Dose  . aspirin (GOODSENSE ASPIRIN) 81 MG chewable tablet Chew 81 mg by mouth daily.   10/17/2015 at Unknown time  . Cholecalciferol (VITAMIN D) 2000 units tablet Take 2,000 Units by mouth daily.   10/17/2015 at Unknown time  . clotrimazole-betamethasone (LOTRISONE) cream Apply 1 application topically daily. Apply to genital region   10/17/2015 at Unknown time  . Cyanocobalamin (RA VITAMIN B-12 TR) 1000 MCG TBCR Take 1,000 mcg by mouth daily.   10/17/2015 at Unknown time  . fluticasone (FLONASE) 50 MCG/ACT nasal spray Place 2 sprays into both nostrils daily.   10/17/2015 at Unknown time  . metoprolol  succinate (TOPROL-XL) 50 MG 24 hr tablet Take 50 mg by mouth daily.   10/17/2015 at Unknown time  . Multiple Vitamin (MULTI-VITAMINS) TABS Take 1 tablet by mouth daily.   10/17/2015 at Unknown time  . hydrochlorothiazide (MICROZIDE) 12.5 MG capsule Take 12.5 mg by mouth daily. Reported on 10/18/2015   Not Taking at Unknown time   Social History   Social History  . Marital Status: Married    Spouse Name: N/A  . Number of Children: N/A  . Years of Education: N/A   Occupational History  . retired    Social History Main Topics  . Smoking status: Never Smoker   . Smokeless tobacco: Not on file  . Alcohol Use: No  . Drug Use: No  . Sexual Activity: Not on file   Other Topics Concern  . Not on file   Social History Narrative    Family History  Problem Relation Age of Onset  . Heart disease Father       Review of systems complete and found to be negative unless listed above      PHYSICAL EXAM  General: Well developed, well nourished, in no acute distress HEENT:  Normocephalic and atramatic Neck:  No JVD.  Lungs: Clear bilaterally to auscultation and percussion. Heart: HRRR . Normal S1 and S2 without gallops or murmurs.  Abdomen: Bowel sounds are positive, abdomen soft and non-tender  Msk:  Back normal, normal gait. Normal strength and tone for age. Extremities: No clubbing, cyanosis or edema.   Neuro: Alert and oriented X 3. Psych:  Good affect, responds appropriately  Labs:   Lab Results  Component Value Date   WBC 10.9* 10/18/2015   HGB 13.1 10/18/2015   HCT 38.0* 10/18/2015   MCV 93.8 10/18/2015   PLT 135* 10/18/2015    Recent Labs Lab 10/18/15 0231  NA 143  K 4.2  CL 109  CO2 29  BUN 20  CREATININE 0.83  CALCIUM 9.2  GLUCOSE 187*   Lab Results  Component Value Date   TROPONINI <0.03 10/18/2015   No results found for: CHOL No results found for: HDL No results found for: LDLCALC No results found for: TRIG No results found for: CHOLHDL No results  found for: LDLDIRECT    Radiology: Dg Chest 2 View  10/17/2015  CLINICAL DATA:  Fall and syncope EXAM: CHEST  2 VIEW COMPARISON:  12/04/2012 FINDINGS: There is chronic blunting of the lateral left costophrenic sulcus from scar. There is no edema, consolidation, effusion, or pneumothorax. Calcified pulmonary granulomas larger on the right. Normal heart size mediastinal contours. No visible fracture. IMPRESSION: No acute finding. Electronically Signed   By: Marnee Spring M.D.   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 No malalignment or acute fracture. No soft tissue abnormality. Distal radius, ulna, and carpal bones appear intact. IMPRESSION: No acute osseous finding. Advanced arthritic changes of the right first CMC joint. Electronically Signed   By: Judie Petit.  Shick M.D.   On: 10/17/2015 22:25   Ct Head Wo Contrast  10/17/2015  CLINICAL DATA:  Fall with head injury.  Initial encounter. EXAM: CT HEAD WITHOUT CONTRAST CT CERVICAL SPINE WITHOUT CONTRAST TECHNIQUE: Multidetector CT imaging of the head and cervical spine was performed following the standard protocol without intravenous contrast. Multiplanar CT image reconstructions of the cervical spine were also generated. COMPARISON:  None. FINDINGS: CT HEAD FINDINGS Brain: No evidence of acute infarction, hemorrhage, hydrocephalus, or mass lesion/mass effect. Age congruent volume loss and periventricular low-density. Vascular: No hyperdense vessel or unexpected calcification. Skull: Negative for fracture or focal lesion. Sinuses/Orbits: Chronic sinusitis with diffuse mucosal thickening. Polypoid structures in the bilateral nasal cavity, larger on the right. No aggressive changes. Retained secretions in the right sphenoid  sinus. Bilateral cataract resection. Other: None. CT CERVICAL SPINE FINDINGS Alignment: No traumatic malalignment. Mild levo curvature the cervical spine could be positional or due to asymmetric facet degeneration on the right. Mild facet mediated C3-4, C4-5, and C5-6 anterolisthesis Skull base and vertebrae: No  acute fracture. No aggressive process. Soft tissues and canal: No prevertebral fluid. No gross canal hematoma. Degenerative: Asymmetric bulky degenerative facet hypertrophy on the right. IMPRESSION: 1. No evidence of acute intracranial or cervical spine injury. 2. Chronic sinusitis with nasal polyps. Electronically Signed   By: Marnee Spring M.D.   On: 10/17/2015 22:09   Ct Cervical Spine Wo Contrast  10/17/2015  CLINICAL DATA:  Fall with head injury.  Initial encounter. EXAM: CT HEAD WITHOUT CONTRAST CT CERVICAL SPINE WITHOUT CONTRAST TECHNIQUE: Multidetector CT imaging of the head and cervical spine was performed following the standard protocol without intravenous contrast. Multiplanar CT image reconstructions of the cervical spine were also generated. COMPARISON:  None. FINDINGS: CT HEAD FINDINGS Brain: No evidence of acute infarction, hemorrhage, hydrocephalus, or mass lesion/mass effect. Age congruent volume loss and periventricular low-density. Vascular: No hyperdense vessel or unexpected  calcification. Skull: Negative for fracture or focal lesion. Sinuses/Orbits: Chronic sinusitis with diffuse mucosal thickening. Polypoid structures in the bilateral nasal cavity, larger on the right. No aggressive changes. Retained secretions in the right sphenoid sinus. Bilateral cataract resection. Other: None. CT CERVICAL SPINE FINDINGS Alignment: No traumatic malalignment. Mild levo curvature the cervical spine could be positional or due to asymmetric facet degeneration on the right. Mild facet mediated C3-4, C4-5, and C5-6 anterolisthesis Skull base and vertebrae: No  acute fracture. No aggressive process.  Soft tissues and canal: No prevertebral fluid. No gross canal hematoma. Degenerative: Asymmetric bulky degenerative facet hypertrophy on the right. IMPRESSION: 1. No evidence of acute intracranial or cervical spine injury. 2. Chronic sinusitis with nasal polyps. Electronically Signed   By: Marnee Spring M.D.   On: 10/17/2015 22:09    EKG: Normal sinus rhythm  ASSESSMENT AND PLAN:   1. Presyncope, of unknown etiology, patient clinically stable, in sinus rhythm, has ruled out for myocardial infarction with negative troponin, unremarkable findings on telemetry  Recommendations  1. Agree with current therapy 2. IV fluid repletion 3. Review 2-D echocardiogram 4. If patient remains clinically stable, may consider discharge later today with follow-up early next week  Signed: Siona Coulston MD,PhD, [facility] 10/18/2015, 8:30 AM

## 2015-10-18 NOTE — Progress Notes (Signed)
*  PRELIMINARY RESULTS* Echocardiogram 2D Echocardiogram has been performed.  Astrid Vides 10/18/2015, 2:58 PM 

## 2015-10-18 NOTE — Discharge Instructions (Signed)
Heart healthy diet. °Activity as tolerated. °

## 2016-02-12 NOTE — Progress Notes (Signed)
 Established Patient Visit   Chief Complaint: Chief Complaint  Patient presents with  . Follow-up    4 months  . Shortness of Breath    with excertion takes care of wife   Date of Service: 02/12/2016 Date of Birth: 1924/07/23 PCP: Rodney JULIANNA PINAL, MD  History of Present Illness: Rodney Navarro is a 82 y.o.male patient who returns for   1.  Paroxysmal atrial fibrillation  2.  Essential hypertension  3.  Hyperlipidemia  4.  60% stenosis mid LAD  5.  Type 2 diabetes  The patient returns today and reports doing well. He denies chest pain or shortness of breath. He has mild exertional dyspnea. He has mild peripheral edema. He denies palpitations or heart racing. He has had no presyncope or syncope. The patient stumbled and fell on 10/17/2015 without loss of consciousness. Carotid ultrasound at the time revealed insignificant disease. 2D echocardiogram on 10/18/2015 revealed normal left ventricular function, with LVEF 55-65%. The patient denies any recent falls. He is active for his age but does not do structured exercise.  The patient has paroxysmal atrial fibrillation, chads vas score 5, on aspirin . The patient continues to be unwilling to start Eliquis due to cost. The patient has a history of stumbling when he stands up or bends over and had a fall in July 2017 as described above.   The patient has hyperlipidemia, LDL cholesterol is 92 on 07/30/2015, on diet therapy. The patient follows a low-cholesterol, low-fat diet.  Past Medical and Surgical History  Past Medical History Past Medical History:  Diagnosis Date  . Atrial fibrillation , unspecified (CMS-HCC)   . Coronary atherosclerosis of native coronary artery   . DM (diabetes mellitus) (CMS-HCC) 08/27/2013  . Esophagitis 08/27/2013  . Essential hypertension, benign   . Hyperlipidemia, unspecified 08/27/2013  . Lumbar disc disease 08/27/2013  . Osteoporosis 08/27/2013    Past Surgical History He has a past surgical history that includes  Cardiac catheterization; Cholecystectomy; and Shoulder arthrocentesis (Right).   Medications and Allergies  Current Medications  Current Outpatient Prescriptions  Medication Sig Dispense Refill  . aspirin  81 MG chewable tablet Take 81 mg by mouth once daily.    . cholecalciferol (VITAMIN D3) 2,000 unit tablet Take 1 tablet (2,000 Units total) by mouth once daily. 90 tablet 3  . clotrimazole-betamethasone (LOTRISONE) 1-0.05 % cream APPLY EVERY DAY AS DIRECTED 45 g 3  . cyanocobalamin  (VITAMIN B12) 1000 MCG tablet Take 1 tablet (1,000 mcg total) by mouth once daily. 90 tablet 3  . fluticasone  (FLONASE ) 50 mcg/actuation nasal spray TWO PUFFS IN EACH NOSTRIL ONCE A DAY 16 g 11  . melatonin 3 mg tablet Take by mouth nightly.    . metoprolol  succinate (TOPROL -XL) 50 MG XL tablet TAKE ONE-HALF TABLET BY MOUTH DAILY 45 tablet 11  . multivitamin tablet Take 1 tablet by mouth once daily.     No current facility-administered medications for this visit.     Allergies: Tetanus antitoxin  Social and Family History  Social History  reports that he has quit smoking. His smoking use included Cigarettes. He smoked 0.50 packs per day. He has never used smokeless tobacco. He reports that he does not drink alcohol  or use drugs.  Family History Family History  Problem Relation Age of Onset  . Heart attack Father   . Coronary artery disease Father   . No Known Problems Mother   . Diabetes type II Sister   . Coronary artery disease Brother   . Diabetes  type II Brother     Review of Systems   Review of Systems: The patient denies chest pain, shortness of breath, orthopnea, paroxysmal nocturnal dyspnea, palpitations, heart racing, presyncope, syncope. He does have peripheral edema. Review of 12 Systems is negative except as described above.  Physical Examination   Vitals:BP (P) 142/70   Pulse (P) 82   Ht (P) 170.2 cm (5' 7)   Wt (P) 71.2 kg (157 lb)   BMI (P) 24.59 kg/m  Ht:(P) 170.2 cm (5'  7) Wt:(P) 71.2 kg (157 lb) ADJ:Anib surface area is 1.83 meters squared (pended). Body mass index is 24.59 kg/m (pended).  General: Alert and oriented. No acute distress. Well-appearing. HEENT: Pupils equally reactive to light and accomodation  Neck: Supple without thyromegaly, carotid pulses 2+ Lungs: Normal effort of breathing. Clear to auscultation bilaterally; no wheezes, rales, rhonchi Heart: Regular rate and rhythm.  No gallops or rubs. 2/6 systolic murmur Abdomen: soft nontender, nondistended, with normal bowel sounds Extremities: no cyanosis, clubbing. Mild bilateral peripheral edema Peripheral Pulses: 2+ radial Skin: Warm, dry, no diaphoresis  Assessment   82 y.o. male with  1. Paroxysmal atrial fibrillation (CMS-HCC)   2. Essential hypertension, benign   3. Diabetes mellitus type 2, diet-controlled (CMS-HCC)   4. Pure hypercholesterolemia   5. S/P cardiac catheterization    82 year old gentleman with paroxysmal atrial fibrillation, deferred starting Eliquis due to cost, currently on aspirin . The patient easily stumbles and falls as described above.  The patient has essential hypertension, systolic blood pressure mildly elevated on current BP medication.   Plan   1.  Continue current medications 2.  Counseled patient about low-sodium diet 3.  DASH diet printed instructions given to the patient 4.  Counseled patient about low-cholesterol diet 5.  Low-fat and cholesterol. Instructions given to the patient 6.  Return to clinic for follow-up in 4 months  No orders of the defined types were placed in this encounter.   Return in about 4 months (around 06/11/2016). I personally performed the service, non-incident to.  [facility])  Exam findings were discussed with Dr. Ammon and the plan was made in collaboration with him.   ANNA MARIA DRANE, PA -C

## 2017-08-25 ENCOUNTER — Encounter (INDEPENDENT_AMBULATORY_CARE_PROVIDER_SITE_OTHER): Payer: Self-pay | Admitting: Vascular Surgery

## 2017-08-25 ENCOUNTER — Ambulatory Visit (INDEPENDENT_AMBULATORY_CARE_PROVIDER_SITE_OTHER): Payer: Medicare Other | Admitting: Vascular Surgery

## 2017-08-25 VITALS — BP 154/84 | HR 91 | Resp 14 | Ht 65.5 in | Wt 155.0 lb

## 2017-08-25 DIAGNOSIS — I10 Essential (primary) hypertension: Secondary | ICD-10-CM | POA: Insufficient documentation

## 2017-08-25 DIAGNOSIS — M7989 Other specified soft tissue disorders: Secondary | ICD-10-CM

## 2017-08-25 NOTE — Progress Notes (Signed)
Subjective:    Patient ID: Rodney Navarro, male    DOB: 1924/10/28, 84 y.o.   MRN: [id number] Chief Complaint  Patient presents with  . New Patient (Initial Visit)    Leg swelling   Presents as a new patient self-referred for evaluation of bilateral lower extremity swelling.  The patient endorses a history of me seeing his wife in the past.  Apparently I had a conversation with the patient and encouraged him to start to wear medical grade 1 compression socks for the edema he was experiencing in his legs.  The patient decided to follow-up in our office today to show me the improvement in his legs since he has been engaging in conservative therapy including wearing medical grade 1 compression socks and elevating his legs.  The patient notes that his swelling has improved and the discomfort that was associated with the swelling has improved.  The patient denies any claudication-like symptoms, rest pain or ulceration to the bilateral lower extremity.  Patient denies any recent surgery or trauma to the bilateral lower extremity.  Patient denies any DVT history to the bilateral lower extremity.  Patient denies any fever, nausea vomiting.  Review of Systems  Constitutional: Negative.   HENT: Negative.   Eyes: Negative.   Respiratory: Negative.   Cardiovascular: Positive for leg swelling.  Gastrointestinal: Negative.   Endocrine: Negative.   Genitourinary: Negative.   Musculoskeletal: Negative.   Skin: Negative.   Allergic/Immunologic: Negative.   Neurological: Negative.   Hematological: Negative.   Psychiatric/Behavioral: Negative.       Objective:   Physical Exam  Constitutional: He is oriented to person, place, and time. He appears well-developed and well-nourished. No distress.  HENT:  Head: Normocephalic and atraumatic.  Right Ear: External ear normal.  Left Ear: External ear normal.  Eyes: Pupils are equal, round, and reactive to light. Conjunctivae and EOM are normal.  Neck:  Normal range of motion.  Cardiovascular: Normal rate, regular rhythm, normal heart sounds and intact distal pulses.  Pulses:      Radial pulses are 2+ on the right side, and 2+ on the left side.       Dorsalis pedis pulses are 2+ on the right side, and 2+ on the left side.       Posterior tibial pulses are 2+ on the right side, and 2+ on the left side.  Pulmonary/Chest: Effort normal and breath sounds normal.  Musculoskeletal: Normal range of motion. He exhibits no edema.  Neurological: He is alert and oriented to person, place, and time.  Skin: Skin is warm and dry. He is not diaphoretic.  Psychiatric: He has a normal mood and affect. His behavior is normal. Judgment and thought content normal.  Vitals reviewed.  BP (!) 154/84 (BP Location: Right Arm, Patient Position: Sitting)   Pulse 91   Resp 14   Ht 5' 5.5" (1.664 m)   Wt 155 lb (70.3 kg)   BMI 25.40 kg/m   Past Medical History:  Diagnosis Date  . A-fib (HCC)   . Diabetes mellitus without complication (HCC)   . Hyperlipidemia   . Hypertension   . Osteoporosis    Social History   Socioeconomic History  . Marital status: Married    Spouse name: Not on file  . Number of children: Not on file  . Years of education: Not on file  . Highest education level: Not on file  Occupational History  . Occupation: retired  Engineer, production  . Physicist, medical  strain: Not on file  . Food insecurity:    Worry: Not on file    Inability: Not on file  . Transportation needs:    Medical: Not on file    Non-medical: Not on file  Tobacco Use  . Smoking status: Never Smoker  . Smokeless tobacco: Never Used  Substance and Sexual Activity  . Alcohol use: No  . Drug use: No  . Sexual activity: Not on file  Lifestyle  . Physical activity:    Days per week: Not on file    Minutes per session: Not on file  . Stress: Not on file  Relationships  . Social connections:    Talks on phone: Not on file    Gets together: Not on file     Attends religious service: Not on file    Active member of club or organization: Not on file    Attends meetings of clubs or organizations: Not on file    Relationship status: Not on file  . Intimate partner violence:    Fear of current or ex partner: Not on file    Emotionally abused: Not on file    Physically abused: Not on file    Forced sexual activity: Not on file  Other Topics Concern  . Not on file  Social History Narrative  . Not on file   Past Surgical History:  Procedure Laterality Date  . none     Family History  Problem Relation Age of Onset  . Heart disease Father    Allergies  Allergen Reactions  . Alendronate Other (See Comments)  . Risedronate Other (See Comments)  . Tetanus Toxoids       Assessment & Plan:  Presents as a new patient self-referred for evaluation of bilateral lower extremity swelling.  The patient endorses a history of me seeing his wife in the past.  Apparently I had a conversation with the patient and encouraged him to start to wear medical grade 1 compression socks for the edema he was experiencing in his legs.  The patient decided to follow-up in our office today to show me the improvement in his legs since he has been engaging in conservative therapy including wearing medical grade 1 compression socks and elevating his legs.  The patient notes that his swelling has improved and the discomfort that was associated with the swelling has improved.  The patient denies any claudication-like symptoms, rest pain or ulceration to the bilateral lower extremity.  Patient denies any recent surgery or trauma to the bilateral lower extremity.  Patient denies any DVT history to the bilateral lower extremity.  Patient denies any fever, nausea vomiting.  1. Swelling of lower extremity - new The patient has been engaging in conservative therapy including wearing medical grade 1 compression socks, elevating his legs and remaining active. The patient notes that this  has provided improvement to the swelling that he was experiencing to his bilateral lower extremity The patient is very pleased with the improvement he has experienced At this time, the patient is not interested in moving forward with any official work-up including a venous duplex to rule out any venous versus lymphatic disease. The patient continue engaging conservative therapy and if he changes his mind in the future we will be happy to provide him these tests. The patient was instructed to call the office in the interim if any worsening edema or ulcerations to the legs, feet or toes occurs. The patient expresses their understanding  2. Essential hypertension -  Stable Encouraged good control as its slows the progression of atherosclerotic disease  Current Outpatient Medications on File Prior to Visit  Medication Sig Dispense Refill  . acyclovir (ZOVIRAX) 200 MG capsule Take 200 mg by mouth 5 (five) times daily.    Marland Kitchen aspirin (GOODSENSE ASPIRIN) 81 MG chewable tablet Chew 81 mg by mouth daily.    . Cholecalciferol (VITAMIN D) 2000 units tablet Take 2,000 Units by mouth daily.    . clotrimazole-betamethasone (LOTRISONE) cream Apply 1 application topically daily. Apply to genital region    . Cyanocobalamin (RA VITAMIN B-12 TR) 1000 MCG TBCR Take 1,000 mcg by mouth daily.    . fluorouracil (EFUDEX) 5 % cream AAA (scalp) BID x 4-weeks, then stop.  Start in winter. For actinic keratoses. (requests home delivery)    . fluticasone (FLONASE) 50 MCG/ACT nasal spray Place 2 sprays into both nostrils daily.    . hydrochlorothiazide (MICROZIDE) 12.5 MG capsule Take 12.5 mg by mouth daily. Reported on 10/18/2015    . Melatonin 3 MG TABS Take by mouth.    . metoprolol succinate (TOPROL-XL) 50 MG 24 hr tablet Take 50 mg by mouth daily.    . Multiple Vitamin (MULTI-VITAMINS) TABS Take 1 tablet by mouth daily.    . vitamin A 16109 UNIT capsule Take by mouth.     No current facility-administered medications on  file prior to visit.    There are no Patient Instructions on file for this visit. No follow-ups on file.  Kyshawn Teal A Michi Herrmann, PA-C

## 2017-10-19 DIAGNOSIS — I480 Paroxysmal atrial fibrillation: Secondary | ICD-10-CM | POA: Insufficient documentation

## 2017-10-19 DIAGNOSIS — E1151 Type 2 diabetes mellitus with diabetic peripheral angiopathy without gangrene: Secondary | ICD-10-CM | POA: Insufficient documentation

## 2019-03-31 HISTORY — PX: OTHER SURGICAL HISTORY: SHX169

## 2019-05-14 ENCOUNTER — Ambulatory Visit: Payer: Medicare Other | Attending: Internal Medicine

## 2019-05-14 DIAGNOSIS — Z23 Encounter for immunization: Secondary | ICD-10-CM | POA: Insufficient documentation

## 2019-05-14 NOTE — Progress Notes (Signed)
   Covid-19 Vaccination Clinic  Name:  Rodney Navarro    MRN: [id number] DOB: 09-Apr-1924  05/14/2019  [name] was observed post Covid-19 immunization for 15 minutes without incidence. He was provided with Vaccine Information Sheet and instruction to access the V-Safe system.   [name] was instructed to call 911 with any severe reactions post vaccine: Marland Kitchen Difficulty breathing  . Swelling of your face and throat  . A fast heartbeat  . A bad rash all over your body  . Dizziness and weakness    Immunizations Administered    Name Date Dose VIS Date Route   Pfizer COVID-19 Vaccine 05/14/2019  9:11 AM 0.3 mL 03/10/2019 Intramuscular   Manufacturer: [employer], [employer]   Lot: [id number]   NDC: [address]-7

## 2019-06-07 ENCOUNTER — Ambulatory Visit: Payer: Medicare Other | Attending: Internal Medicine

## 2019-06-07 DIAGNOSIS — Z23 Encounter for immunization: Secondary | ICD-10-CM

## 2019-06-07 NOTE — Progress Notes (Signed)
   Covid-19 Vaccination Clinic  Name:  Rodney Navarro    MRN: [id number] DOB: 1924-05-02  06/07/2019  Rodney Navarro was observed post Covid-19 immunization for 15 minutes without incident. He was provided with Vaccine Information Sheet and instruction to access the V-Safe system.   Rodney Navarro was instructed to call 911 with any severe reactions post vaccine: Marland Kitchen Difficulty breathing  . Swelling of face and throat  . A fast heartbeat  . A bad rash all over body  . Dizziness and weakness   Immunizations Administered    Name Date Dose VIS Date Route   Pfizer COVID-19 Vaccine 06/07/2019  2:01 PM 0.3 mL 03/10/2019 Intramuscular   Manufacturer: [employer], [employer]   Lot: [id number]   NDC: [address]-2

## 2020-07-13 DIAGNOSIS — Z85828 Personal history of other malignant neoplasm of skin: Secondary | ICD-10-CM | POA: Insufficient documentation

## 2021-07-08 ENCOUNTER — Ambulatory Visit
Admission: RE | Admit: 2021-07-08 | Discharge: 2021-07-08 | Disposition: A | Discharge status: Home / Self Care | Payer: Medicare Other | Source: Ambulatory Visit | Attending: Internal Medicine | Admitting: Internal Medicine

## 2021-07-08 ENCOUNTER — Other Ambulatory Visit: Payer: Self-pay | Admitting: Internal Medicine

## 2021-07-08 DIAGNOSIS — S22000A Wedge compression fracture of unspecified thoracic vertebra, initial encounter for closed fracture: Secondary | ICD-10-CM | POA: Insufficient documentation

## 2021-07-08 DIAGNOSIS — M5116 Intervertebral disc disorders with radiculopathy, lumbar region: Secondary | ICD-10-CM

## 2021-07-14 DIAGNOSIS — M5416 Radiculopathy, lumbar region: Secondary | ICD-10-CM | POA: Insufficient documentation

## 2022-07-09 ENCOUNTER — Ambulatory Visit (HOSPITAL_COMMUNITY)
Admission: RE | Admit: 2022-07-09 | Discharge: 2022-07-09 | Disposition: A | Discharge status: Home / Self Care | Payer: Medicare Other | Source: Ambulatory Visit | Attending: Cardiology | Admitting: Cardiology

## 2022-07-09 ENCOUNTER — Other Ambulatory Visit: Payer: Self-pay | Admitting: Cardiology

## 2022-07-09 DIAGNOSIS — R931 Abnormal findings on diagnostic imaging of heart and coronary circulation: Secondary | ICD-10-CM | POA: Insufficient documentation

## 2022-09-06 ENCOUNTER — Emergency Department: Payer: Medicare Other

## 2022-09-06 ENCOUNTER — Emergency Department
Admission: EM | Admit: 2022-09-06 | Discharge: 2022-09-06 | Disposition: A | Discharge status: Home / Self Care | Payer: Medicare Other | Attending: Emergency Medicine | Admitting: Emergency Medicine

## 2022-09-06 DIAGNOSIS — Y92410 Unspecified street and highway as the place of occurrence of the external cause: Secondary | ICD-10-CM | POA: Insufficient documentation

## 2022-09-06 DIAGNOSIS — I10 Essential (primary) hypertension: Secondary | ICD-10-CM | POA: Insufficient documentation

## 2022-09-06 DIAGNOSIS — E119 Type 2 diabetes mellitus without complications: Secondary | ICD-10-CM | POA: Diagnosis not present

## 2022-09-06 DIAGNOSIS — R0789 Other chest pain: Secondary | ICD-10-CM | POA: Diagnosis not present

## 2022-09-06 NOTE — ED Notes (Signed)
Family at bedside. 
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
## 2022-09-06 NOTE — Discharge Instructions (Signed)
Your chest x-ray did not show any injuries.  If you develop any new pains that are concerning you such as headache or worsening chest pain then please return the emergency department.
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
## 2022-09-06 NOTE — ED Notes (Signed)
Patient has called for a ride.

## 2022-11-07 ENCOUNTER — Other Ambulatory Visit: Payer: Self-pay

## 2022-11-07 ENCOUNTER — Emergency Department
Admission: EM | Admit: 2022-11-07 | Discharge: 2022-11-07 | Disposition: A | Discharge status: Home / Self Care | Payer: Medicare Other | Attending: Emergency Medicine | Admitting: Emergency Medicine

## 2022-11-07 ENCOUNTER — Emergency Department: Payer: Medicare Other

## 2022-11-07 DIAGNOSIS — W1839XA Other fall on same level, initial encounter: Secondary | ICD-10-CM | POA: Insufficient documentation

## 2022-11-07 DIAGNOSIS — S4992XA Unspecified injury of left shoulder and upper arm, initial encounter: Secondary | ICD-10-CM | POA: Diagnosis present

## 2022-11-07 DIAGNOSIS — S40012A Contusion of left shoulder, initial encounter: Secondary | ICD-10-CM | POA: Diagnosis not present

## 2022-11-07 DIAGNOSIS — E119 Type 2 diabetes mellitus without complications: Secondary | ICD-10-CM | POA: Diagnosis not present
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
## 2022-11-07 NOTE — ED Triage Notes (Signed)
Arrives states fell last night while weed eating..  C/O left shoulder pain.  Some bruising seen to shoulder.  Also fell onto left forearm.    Full ROM demonstrated by patient.  C/o pain to shoulder when lifting arm up.

## 2023-01-25 ENCOUNTER — Other Ambulatory Visit: Payer: Self-pay

## 2023-01-25 ENCOUNTER — Emergency Department: Payer: Medicare Other

## 2023-01-25 ENCOUNTER — Other Ambulatory Visit: Payer: Self-pay | Admitting: Family Medicine

## 2023-01-25 ENCOUNTER — Telehealth: Payer: Self-pay | Admitting: Neurosurgery

## 2023-01-25 ENCOUNTER — Emergency Department
Admission: EM | Admit: 2023-01-25 | Discharge: 2023-01-25 | Disposition: A | Discharge status: Home / Self Care | Payer: Medicare Other | Attending: Emergency Medicine | Admitting: Emergency Medicine

## 2023-01-25 DIAGNOSIS — I10 Essential (primary) hypertension: Secondary | ICD-10-CM | POA: Insufficient documentation

## 2023-01-25 DIAGNOSIS — E119 Type 2 diabetes mellitus without complications: Secondary | ICD-10-CM | POA: Insufficient documentation

## 2023-01-25 DIAGNOSIS — M5416 Radiculopathy, lumbar region: Secondary | ICD-10-CM | POA: Diagnosis not present

## 2023-01-25 DIAGNOSIS — M545 Low back pain, unspecified: Secondary | ICD-10-CM | POA: Diagnosis present

## 2023-01-25 MED ORDER — LIDOCAINE 5 % EX PTCH
1.0000 | MEDICATED_PATCH | CUTANEOUS | Status: DC
  Administered 2023-01-25: 1 via TRANSDERMAL
  Filled 2023-01-25: qty 1

## 2023-01-25 MED ORDER — OXYCODONE HCL 5 MG PO TABS: 5.0000 mg | ORAL_TABLET | Freq: Three times a day (TID) | ORAL | 0 refills | Status: DC | PRN

## 2023-01-25 MED ORDER — LIDOCAINE 5 % EX PTCH: 1.0000 | MEDICATED_PATCH | Freq: Two times a day (BID) | CUTANEOUS | 0 refills | Status: DC

## 2023-01-25 MED ORDER — OXYCODONE-ACETAMINOPHEN 5-325 MG PO TABS
1.0000 | ORAL_TABLET | Freq: Once | ORAL | Status: AC
  Administered 2023-01-25: 1 via ORAL
  Filled 2023-01-25: qty 1

## 2023-01-25 NOTE — ED Triage Notes (Signed)
Pt to ED [facility] from home for lower back pain since Friday. Denies falls or injuries. Also reports right sided leg and ankle pain from injury in 2014. Pt appears uncomfortable. Able to ambulate with assistance  Taking oxycodone prn

## 2023-01-25 NOTE — Telephone Encounter (Signed)
Rodney Navarro and I discussed with Dr Katrinka Blazing in office. He would like an MRI ordered prior to seeing the patient for lumbar radiculopathy.

## 2023-01-25 NOTE — ED Provider Notes (Signed)
[facility] Provider Note    Event Date/Time   First MD Initiated Contact with Patient 01/25/23 0830     (approximate)   History   Chief Complaint Back Pain   HPI  Rodney Navarro is a 89 y.o. male with past medical history of hypertension, hyperlipidemia, diabetes, and atrial fibrillation who presents to the ED complaining of back pain.  Patient reports that he has been dealing with worsening pain in the middle of his lower back for the past 3 days.  He denies any recent falls but states he has had fractures in his back with minimal trauma in the past.  Pain is described as sharp and worse with certain movements, radiates towards his right hip and down the back of his right leg.  He denies any weakness in either leg and has not had any numbness in his groin or difficulty urinating.  He took some oxycodone yesterday that his PCP had prescribed in the past for back issues with some relief, has not taken any medication for pain today.     Physical Exam   Triage Vital Signs: ED Triage Vitals  Encounter Vitals Group     BP      Systolic BP Percentile      Diastolic BP Percentile      Pulse      Resp      Temp      Temp src      SpO2      Weight      Height      Head Circumference      Peak Flow      Pain Score      Pain Loc      Pain Education      Exclude from Growth Chart     Most recent vital signs: Vitals:   01/25/23 0836 01/25/23 0838  BP:  (!) 164/83  Pulse: 84   Resp:    Temp: 97.8 F (36.6 C)   SpO2: 97%     Constitutional: Alert and oriented. Eyes: Conjunctivae are normal. Head: Atraumatic. Nose: No congestion/rhinnorhea. Mouth/Throat: Mucous membranes are moist.  Cardiovascular: Normal rate, regular rhythm. Grossly normal heart sounds.  2+ radial and DP pulses bilaterally. Respiratory: Normal respiratory effort.  No retractions. Lungs CTAB. Gastrointestinal: Soft and nontender. No distention.  No CVA tenderness  bilaterally. Musculoskeletal: No lower extremity tenderness nor edema.  Midline lumbar spinal tenderness to palpation noted.  No midline thoracic spinal tenderness to palpation. Neurologic:  Normal speech and language. No gross focal neurologic deficits are appreciated.    ED Results / Procedures / Treatments   Labs (all labs ordered are listed, but only abnormal results are displayed) Labs Reviewed - No data to display  RADIOLOGY CT hip reviewed and interpreted by me with no fracture or dislocation.  PROCEDURES:  Critical Care performed: No  Procedures   MEDICATIONS ORDERED IN ED: Medications  lidocaine (LIDODERM) 5 % 1 patch (1 patch Transdermal Patch Applied 01/25/23 0908)  oxyCODONE-acetaminophen (PERCOCET/ROXICET) 5-325 MG per tablet 1 tablet (1 tablet Oral Given 01/25/23 0907)     IMPRESSION / MDM / ASSESSMENT AND PLAN / ED COURSE  I reviewed the triage vital signs and the nursing notes.                              89 y.o. male with past medical history of hypertension, hyperlipidemia, diabetes, and atrial  fibrillation who presents to the ED with gradually worsening pain in the middle of his lower back for the past 3 days.  Patient's presentation is most consistent with acute complicated illness / injury requiring diagnostic workup.  Differential diagnosis includes, but is not limited to, lumbar radiculopathy, lumbar strain, cauda equina, compression fracture.  Patient uncomfortable but nontoxic-appearing and in no acute distress, vital signs are unremarkable.  He is neurovascular intact to his bilateral lower extremities, no findings concerning for cauda equina.  He is high risk for occult fracture given his advanced age and prior injuries, we will check CT of his lumbar spine given midline tenderness.  We will treat symptomatically with Lidoderm patch and dose of Percocet, reassess following imaging.  Given his benign abdominal exam with no CVA tenderness, no concerns  for intra-abdominal process at this time and he denies any urinary symptoms.  CT of right hip is unremarkable, CT of lumbar spine shows no acute bony injury, does show evidence of right foraminal disc extrusion at L2-L3 which could be the source of patient's symptoms.  He is feeling better following dose of pain medication and Lidoderm patch, ambulatory with the assistance of a walker and is appropriate for outpatient management.  We will refer to neurosurgery for outpatient follow-up, prescribe short course of pain medication and Lidoderm patches.  He was also counseled to follow-up with his PCP for physical therapy, patient and family encouraged to have him return to the ED for new or worsening symptoms.  Patient and family agree with plan.      FINAL CLINICAL IMPRESSION(S) / ED DIAGNOSES   Final diagnoses:  Lumbar radiculopathy     Rx / DC Orders   ED Discharge Orders          Ordered    lidocaine (LIDODERM) 5 %  Every 12 hours        01/25/23 1101    oxyCODONE (ROXICODONE) 5 MG immediate release tablet  Every 8 hours PRN        01/25/23 1101             Note:  This document was prepared using Dragon voice recognition software and may include unintentional dictation errors.   Chesley Noon, MD 01/25/23 [phone]

## 2023-01-25 NOTE — ED Notes (Signed)
Patient transported to CT 

## 2023-01-25 NOTE — Telephone Encounter (Signed)
Orders have been placed.

## 2023-01-25 NOTE — ED Notes (Signed)
Pt. was ambulatory and able to get up from bed with no additional assistance beside the walker and was able to stand and walk out of room and back.

## 2023-01-25 NOTE — Telephone Encounter (Signed)
Follow-up in 2 to 4 weeks with a MRI lumbar spine, ER request   Wynelle Link order MRI

## 2023-01-27 NOTE — Telephone Encounter (Signed)
MRI 02/09/23 Dr.Smith 02/24/23

## 2023-01-27 NOTE — Telephone Encounter (Signed)
Daughter will call and scheduled MRI

## 2023-02-02 ENCOUNTER — Ambulatory Visit: Payer: Medicare Other

## 2023-02-03 ENCOUNTER — Ambulatory Visit: Payer: Medicare Other | Admitting: Orthopedic Surgery

## 2023-02-09 ENCOUNTER — Ambulatory Visit
Admission: RE | Admit: 2023-02-09 | Discharge: 2023-02-09 | Disposition: A | Discharge status: Home / Self Care | Payer: Medicare Other | Source: Ambulatory Visit | Attending: Neurosurgery | Admitting: Neurosurgery

## 2023-02-09 DIAGNOSIS — M5416 Radiculopathy, lumbar region: Secondary | ICD-10-CM | POA: Insufficient documentation

## 2023-02-19 ENCOUNTER — Encounter: Payer: Self-pay | Admitting: Neurosurgery

## 2023-02-19 NOTE — Progress Notes (Unsigned)
Referring Physician:  Danella Penton, MD [address] [facility] [address] [facility] [place],  [place] [address]  Primary Physician:  Danella Penton, MD  History of Present Illness: 02/24/2023 Mr. Rodney Navarro is here today with a chief complaint of back pain with right-sided radiculopathy.  He was seen in the emergency department recently.  He has back pain that radiates down his right lower extremity.  He does feel that this is gotten better.  It has been going on for approximately 1 month.  He does feel certain positions make it worse.  Is approximately 3 out of 10.  He does feel like it gets worse while he is exerting himself.  He does feel better with heat and rest.  Not having any bowel or bladder dysfunction.  Feels like his strength is improving.  Is not currently involved in physical therapy.  He has had previous epidural steroid injections for prior disc herniation.  Not any for this current exacerbation.   I have utilized the care everywhere function in epic to review the outside records available from external health systems.  Review of Systems:  A 10 point review of systems is negative, except for the pertinent positives and negatives detailed in the HPI.  Past Medical History: Past Medical History:  Diagnosis Date   A-fib (HCC)    Diabetes mellitus without complication (HCC)    Hyperlipidemia    Hypertension    Osteoporosis     Past Surgical History: Past Surgical History:  Procedure Laterality Date   CARDIAC CATHETERIZATION     CHOLECYSTECTOMY     excision skin cancer left forearm  03/31/2019   SHOULDER ARTHROCENTESIS Right     Allergies: Allergies as of 02/24/2023 - Review Complete 02/24/2023  Allergen Reaction Noted   Alendronate Other (See Comments) 10/18/2015   Risedronate Other (See Comments) 10/18/2015   Tetanus toxoids  10/17/2015    Medications:  Current Outpatient Medications:    acetaminophen (TYLENOL) 500 MG tablet, Take 500 mg by  mouth daily., Disp: , Rfl:    ascorbic acid (VITAMIN C) 1000 MG tablet, Take 500 mg by mouth daily., Disp: , Rfl:    aspirin (GOODSENSE ASPIRIN) 81 MG chewable tablet, Chew 81 mg by mouth daily., Disp: , Rfl:    Cholecalciferol (VITAMIN D) 2000 units tablet, Take 2,000 Units by mouth daily., Disp: , Rfl:    Cyanocobalamin (RA VITAMIN B-12 TR) 1000 MCG TBCR, Take 1,000 mcg by mouth daily., Disp: , Rfl:    fluticasone (FLONASE) 50 MCG/ACT nasal spray, Place 2 sprays into both nostrils daily., Disp: , Rfl:    Melatonin 3 MG TABS, Take by mouth., Disp: , Rfl:    methylPREDNISolone (MEDROL DOSEPAK) 4 MG TBPK tablet, Take as Directed, per package instructions, Disp: 1 each, Rfl: 0   metoprolol succinate (TOPROL-XL) 50 MG 24 hr tablet, Take 50 mg by mouth daily., Disp: , Rfl:    Multiple Vitamin (MULTI-VITAMINS) TABS, Take 1 tablet by mouth daily., Disp: , Rfl:    pantoprazole (PROTONIX) 40 MG tablet, Take 40 mg by mouth daily., Disp: , Rfl:    traZODone (DESYREL) 50 MG tablet, Take 50 mg by mouth at bedtime., Disp: , Rfl:   Social History: Social History   Tobacco Use   Smoking status: Former    Types: Cigarettes   Smokeless tobacco: Never  Substance Use Topics   Alcohol use: No   Drug use: No    Family Medical History: Family History  Problem  Relation Age of Onset   Heart disease Father     Physical Examination: Vitals:   02/24/23 1005  BP: (!) 140/84    General: Patient is in no apparent distress. Attention to examination is appropriate.  Neck:   Supple.  Full range of motion.  Respiratory: Patient is breathing without any difficulty.   NEUROLOGICAL:     Awake, alert, oriented to person, place, and time.  Speech is clear and fluent.   Cranial Nerves: Pupils equal round and reactive to light.  Facial tone is symmetric.  Facial sensation is symmetric. Shoulder shrug is symmetric. Tongue protrusion is midline.    Strength: He is at least antigravity in his bilateral lower  extremities proximally and distally.  He is able to stand unassisted as long as he uses his arms to help him get up.  He uses a walker to ambulate.  He has some decrease in sensation in the right lateral anterior thigh     Imaging: Narrative & Impression  CLINICAL DATA:  Lumbar radiculopathy.  Diffuse pain.  Numbness.   EXAM: MRI LUMBAR SPINE WITHOUT CONTRAST   TECHNIQUE: Multiplanar, multisequence MR imaging of the lumbar spine was performed. No intravenous contrast was administered.   COMPARISON:  Lumbar spine CT 01/25/2023 and MRI 07/08/2021   FINDINGS: Segmentation:  Standard.   Alignment: Unchanged grade 1 anterolisthesis of L4 on L5 and L5 on S1. Mildly exaggerated lumbar lordosis.   Vertebrae: No fracture, suspicious marrow lesion, or significant marrow edema.   Conus medullaris and cauda equina: Conus extends to the T12-L1 level. Conus and cauda equina appear normal.   Paraspinal and other soft tissues: Aortic atherosclerosis.   Disc levels:   T12-L1: Unchanged perineural cyst remodeling the right T12 pedicle. No disc herniation or stenosis.   L1-2: Disc bulging and mild facet hypertrophy result in mild bilateral lateral recess stenosis and mild bilateral neural foraminal stenosis without spinal stenosis, unchanged from the prior MRI.   L2-3: Disc bulging, a large right subarticular and right foraminal disc extrusion, and moderate facet and ligamentum flavum hypertrophy result in mild spinal stenosis, severe right and mild left lateral recess stenosis, and severe right and mild-to-moderate left neural foraminal stenosis. The disc extrusion has enlarged with worsening right lateral recess and right neural foraminal stenosis resulting in right L2 and possibly L3 nerve root impingement.   L3-4: A large left paracentral to left subarticular disc extrusion on the prior MRI has resolved with improved left lateral recess patency. Disc bulging and moderate facet  and ligamentum flavum hypertrophy result in mild bilateral lateral recess stenosis and mild right and moderate left neural foraminal stenosis without significant spinal stenosis.   L4-5: Anterolisthesis with mild bulging of uncovered disc and severe facet hypertrophy result in mild left greater than right lateral recess stenosis and mild bilateral neural foraminal stenosis without spinal stenosis, unchanged from the prior MRI.   L5-S1: Anterolisthesis with mild bulging of uncovered disc and severe facet hypertrophy result in mild left neural foraminal stenosis without spinal stenosis, unchanged.   IMPRESSION: 1. Enlarging right-sided disc extrusion at L2-3 with severe right lateral recess and right neural foraminal stenosis. 2. Resolution of a large left-sided disc extrusion at L3-4 with improved left lateral recess patency.     Electronically Signed   By: Sebastian Ache M.D.   On: 02/23/2023 17:48      I have personally reviewed the images and agree with the above interpretation.  Medical Decision Making/Assessment and Plan: Mr. Vanblaricom is  a pleasant 89 y.o. male with recent exacerbation of back pain and right lower extremity pain.  He was seen in the emergency department.  Found to have a herniated disc.  Sent to neurosurgery for evaluation.  His pains been going on for approximately 4 weeks.  He feels like it has been steadily improving but is not back to his baseline.  He has found that rest improves his issues and so it is laying down, however he does get worse with exertions.  He is not having any major gross deficits.  He does have some sensory changes.  He has not tried any conservative management at this point.  We have counseled him on starting with physical therapy.  He stated that this can help with his back pain and also set him up for improvement with his radiculopathy.  That this is also necessary in case he needs further treatment for his disc herniation including possible  lumbar spine injections.  For the acute nature of the radiculopathy and back pain we have prescribed him a Medrol Dosepak, this can often help with acute exacerbations.  Notably this is different from his prior disc herniation.  Pain is in a different distribution.  Also we are able to see resolution of his previous disc herniation on the repeat MRI that was just obtained.  This episode seems to be attributed to a different disc herniation.  Thank you for involving me in the care of this patient.   Spent a total of 40 minutes with this patient, reviewing his imaging and records, performing face-to-face evaluation and counseling of the natural history and plan progression, coordinating his plan going forward, and on education.  Lovenia Kim MD/MSCR Neurosurgery

## 2023-02-24 ENCOUNTER — Ambulatory Visit: Payer: Medicare Other | Admitting: Neurosurgery

## 2023-02-24 ENCOUNTER — Encounter: Payer: Self-pay | Admitting: Neurosurgery

## 2023-02-24 VITALS — BP 140/84 | Ht 68.0 in | Wt 113.0 lb

## 2023-02-24 DIAGNOSIS — M5126 Other intervertebral disc displacement, lumbar region: Secondary | ICD-10-CM

## 2023-02-24 DIAGNOSIS — M5416 Radiculopathy, lumbar region: Secondary | ICD-10-CM | POA: Diagnosis not present

## 2023-02-24 MED ORDER — METHYLPREDNISOLONE 4 MG PO TBPK
ORAL_TABLET | ORAL | 0 refills | Status: DC
Start: 2023-02-24 — End: 2023-10-18

## 2023-02-24 NOTE — Patient Instructions (Addendum)
It was a pleasure seeing you today at [facility] neurosurgery in [place].  We saw you for a new herniated lumbar disc.  This is causing the pain that is going into your right leg.  In order to treat this we will start with a round of steroids that has been sent to your pharmacy.  If you continue to have pain we may be able to perform a lumbar injection like you have had before for your previous herniated disc.  This current herniation is in a different location from your previous.  I have referred you for physical therapy indicates that you need to have a injection.  Most insurances require physical therapy prior to any injections.

## 2023-09-29 NOTE — Progress Notes (Signed)
                            Patient Profile:   Rodney Navarro  is a 89 y.o.  male Chief Complaint  Patient presents with  . Follow-up      PROBLEM LIST: Past Medical History:  Diagnosis Date  . Atrial fibrillation (CMS/HHS-HCC)   . Coronary atherosclerosis of native coronary artery   . DM (diabetes mellitus) (CMS/HHS-HCC) 08/27/2013  . Esophagitis 08/27/2013  . Essential hypertension, benign   . Hyperlipidemia 08/27/2013  . Lumbar disc disease 08/27/2013  . Osteoporosis 08/27/2013    Past Surgical History:  Procedure Laterality Date  . OTHER SURGERY  2021   Excision skin cancer left forearm  . CARDIAC CATHETERIZATION    . CHOLECYSTECTOMY    . SHOULDER ARTHROCENTESIS Right     ALLERGIES: Allergies  Allergen Reactions  . Tetanus Antitoxin Unknown    CURRENT MEDICATIONS: Current Outpatient Medications  Medication Sig Dispense Refill  . acyclovir (ZOVIRAX) 200 MG capsule Take 1 capsule (200 mg total) by mouth 3 (three) times daily 90 capsule 11  . aspirin  81 MG chewable tablet Take 81 mg by mouth once daily.    . multivitamin tablet Take 1 tablet by mouth once daily    . pantoprazole  (PROTONIX ) 40 MG DR tablet TAKE 1 TABLET BY MOUTH 2 TIMES DAILY BEFORE MEALS 60 tablet 3   No current facility-administered medications for this visit.      HPI   CLINICAL SUMMARY:  Patient doing dramatically better.  Appetite improved, all of his pain is gone, trouble with insomnia.  Has chronic right ankle pain.  Blood work really only significant for hemoglobin down 11.5 from 12.7.  ROS: Review of systems is unremarkable for any active cardiac, respiratory, GI, GU, hematologic, neurologic, dermatologic, HEENT, or psychiatric symptoms except as noted above, 10 systems reviewed.  No fevers, chills, or constitutional symptoms.   PHYSICAL EXAM  Vital signs:  BP (!) 150/80   Pulse 91   Wt 51.9 kg (114 lb 6.4 oz)   SpO2 97%   BMI 18.39 kg/m  Body mass index is 18.39  kg/m.   Wt Readings from Last 3 Encounters:  09/29/23 51.9 kg (114 lb 6.4 oz)  09/15/23 50.3 kg (110 lb 12.8 oz)  06/30/23 52.3 kg (115 lb 6.4 oz)     BP Readings from Last 3 Encounters:  09/29/23 (!) 150/80  09/15/23 130/80  06/30/23 130/80    Constitutional:NAD Neck: supple, no thyromegaly, good ROM Respiratory:clear to auscultation, no rales or wheezes Cardiovascular:RRR, no murmur or gallop Abdominal:soft, good BS, NT Ext: no edema, good peripheral pulses Neuro: alert and oriented X 3, grossly nonfocal     ASSESSMENT/PLAN   Malnutrition/lethargy-dramatic improvement coming off medication and having prednisone.  Will drop prednisone down to 5 mg daily for 10 days and then stop.  May need bursts of steroids, low-dose in the future, very responsive Weight loss-has gained 4 pounds Constipation-MiraLAX Dramatic improvement  Dispo:   No follow-ups on file.

## 2023-10-17 ENCOUNTER — Emergency Department

## 2023-10-17 ENCOUNTER — Inpatient Hospital Stay
Admission: EM | Admit: 2023-10-17 | Discharge: 2023-10-21 | DRG: 193 | Disposition: A | Discharge status: Skilled Nursing Facility | Attending: Internal Medicine | Admitting: Internal Medicine

## 2023-10-17 ENCOUNTER — Other Ambulatory Visit: Payer: Self-pay

## 2023-10-17 DIAGNOSIS — I4891 Unspecified atrial fibrillation: Secondary | ICD-10-CM

## 2023-10-17 DIAGNOSIS — R000 Tachycardia, unspecified: Secondary | ICD-10-CM | POA: Diagnosis present

## 2023-10-17 DIAGNOSIS — Z87891 Personal history of nicotine dependence: Secondary | ICD-10-CM

## 2023-10-17 DIAGNOSIS — E785 Hyperlipidemia, unspecified: Secondary | ICD-10-CM | POA: Diagnosis present

## 2023-10-17 DIAGNOSIS — I480 Paroxysmal atrial fibrillation: Secondary | ICD-10-CM | POA: Diagnosis present

## 2023-10-17 DIAGNOSIS — I2510 Atherosclerotic heart disease of native coronary artery without angina pectoris: Secondary | ICD-10-CM | POA: Diagnosis present

## 2023-10-17 DIAGNOSIS — Z9181 History of falling: Secondary | ICD-10-CM

## 2023-10-17 DIAGNOSIS — Z8249 Family history of ischemic heart disease and other diseases of the circulatory system: Secondary | ICD-10-CM

## 2023-10-17 DIAGNOSIS — R7989 Other specified abnormal findings of blood chemistry: Secondary | ICD-10-CM | POA: Diagnosis present

## 2023-10-17 DIAGNOSIS — Z681 Body mass index (BMI) 19 or less, adult: Secondary | ICD-10-CM

## 2023-10-17 DIAGNOSIS — R54 Age-related physical debility: Secondary | ICD-10-CM | POA: Diagnosis present

## 2023-10-17 DIAGNOSIS — Z66 Do not resuscitate: Secondary | ICD-10-CM | POA: Diagnosis present

## 2023-10-17 DIAGNOSIS — Z7982 Long term (current) use of aspirin: Secondary | ICD-10-CM | POA: Diagnosis not present

## 2023-10-17 DIAGNOSIS — H9190 Unspecified hearing loss, unspecified ear: Secondary | ICD-10-CM | POA: Diagnosis present

## 2023-10-17 DIAGNOSIS — Z887 Allergy status to serum and vaccine status: Secondary | ICD-10-CM | POA: Diagnosis not present

## 2023-10-17 DIAGNOSIS — G4700 Insomnia, unspecified: Secondary | ICD-10-CM | POA: Diagnosis present

## 2023-10-17 DIAGNOSIS — Z85828 Personal history of other malignant neoplasm of skin: Secondary | ICD-10-CM | POA: Diagnosis not present

## 2023-10-17 DIAGNOSIS — J189 Pneumonia, unspecified organism: Principal | ICD-10-CM | POA: Diagnosis present

## 2023-10-17 DIAGNOSIS — M810 Age-related osteoporosis without current pathological fracture: Secondary | ICD-10-CM | POA: Diagnosis present

## 2023-10-17 DIAGNOSIS — Z79899 Other long term (current) drug therapy: Secondary | ICD-10-CM | POA: Diagnosis not present

## 2023-10-17 DIAGNOSIS — S51819A Laceration without foreign body of unspecified forearm, initial encounter: Secondary | ICD-10-CM | POA: Diagnosis present

## 2023-10-17 DIAGNOSIS — R636 Underweight: Secondary | ICD-10-CM

## 2023-10-17 DIAGNOSIS — W19XXXA Unspecified fall, initial encounter: Secondary | ICD-10-CM | POA: Diagnosis present

## 2023-10-17 DIAGNOSIS — E43 Unspecified severe protein-calorie malnutrition: Secondary | ICD-10-CM | POA: Diagnosis present

## 2023-10-17 DIAGNOSIS — E440 Moderate protein-calorie malnutrition: Secondary | ICD-10-CM | POA: Diagnosis present

## 2023-10-17 DIAGNOSIS — A419 Sepsis, unspecified organism: Principal | ICD-10-CM

## 2023-10-17 DIAGNOSIS — E1165 Type 2 diabetes mellitus with hyperglycemia: Secondary | ICD-10-CM | POA: Diagnosis present

## 2023-10-17 DIAGNOSIS — Z888 Allergy status to other drugs, medicaments and biological substances status: Secondary | ICD-10-CM

## 2023-10-17 DIAGNOSIS — I10 Essential (primary) hypertension: Secondary | ICD-10-CM | POA: Diagnosis present

## 2023-10-17 DIAGNOSIS — Z9861 Coronary angioplasty status: Secondary | ICD-10-CM

## 2023-10-17 DIAGNOSIS — D649 Anemia, unspecified: Secondary | ICD-10-CM | POA: Diagnosis present

## 2023-10-17 LAB — EXPECTORATED SPUTUM ASSESSMENT W GRAM STAIN, RFLX TO RESP C

## 2023-10-17 LAB — CBC WITH DIFFERENTIAL/PLATELET
Abs Immature Granulocytes: 0.15 K/uL — ABNORMAL HIGH (ref 0.00–0.07)
Basophils Absolute: 0 K/uL (ref 0.0–0.1)
Basophils Relative: 0 %
Eosinophils Absolute: 0 K/uL (ref 0.0–0.5)
Eosinophils Relative: 0 %
HCT: 30.8 % — ABNORMAL LOW (ref 39.0–52.0)
Hemoglobin: 10.1 g/dL — ABNORMAL LOW (ref 13.0–17.0)
Immature Granulocytes: 1 %
Lymphocytes Relative: 6 %
Lymphs Abs: 0.8 K/uL (ref 0.7–4.0)
MCH: 31.6 pg (ref 26.0–34.0)
MCHC: 32.8 g/dL (ref 30.0–36.0)
MCV: 96.3 fL (ref 80.0–100.0)
Monocytes Absolute: 0.9 K/uL (ref 0.1–1.0)
Monocytes Relative: 7 %
Neutro Abs: 11.3 K/uL — ABNORMAL HIGH (ref 1.7–7.7)
Neutrophils Relative %: 86 %
Platelets: 191 K/uL (ref 150–400)
RBC: 3.2 MIL/uL — ABNORMAL LOW (ref 4.22–5.81)
RDW: 14.5 % (ref 11.5–15.5)
WBC: 13.1 K/uL — ABNORMAL HIGH (ref 4.0–10.5)
nRBC: 0 % (ref 0.0–0.2)

## 2023-10-17 LAB — COMPREHENSIVE METABOLIC PANEL WITH GFR
ALT: 25 U/L (ref 0–44)
AST: 27 U/L (ref 15–41)
Albumin: 2.6 g/dL — ABNORMAL LOW (ref 3.5–5.0)
Alkaline Phosphatase: 62 U/L (ref 38–126)
Anion gap: 11 (ref 5–15)
BUN: 17 mg/dL (ref 8–23)
CO2: 24 mmol/L (ref 22–32)
Calcium: 8.7 mg/dL — ABNORMAL LOW (ref 8.9–10.3)
Chloride: 103 mmol/L (ref 98–111)
Creatinine, Ser: 0.75 mg/dL (ref 0.61–1.24)
GFR, Estimated: >60 mL/min (ref >60)
Glucose, Bld: 271 mg/dL — ABNORMAL HIGH (ref 70–99)
Potassium: 4.1 mmol/L (ref 3.5–5.1)
Sodium: 138 mmol/L (ref 135–145)
Total Bilirubin: 0.7 mg/dL (ref 0.0–1.2)
Total Protein: 5.9 g/dL — ABNORMAL LOW (ref 6.5–8.1)

## 2023-10-17 LAB — MAGNESIUM: Magnesium: 1.7 mg/dL (ref 1.7–2.4)

## 2023-10-17 LAB — LACTIC ACID, PLASMA
Lactic Acid, Venous: 1.3 mmol/L (ref 0.5–1.9)
Lactic Acid, Venous: 1.2 mmol/L (ref 0.5–1.9)

## 2023-10-17 LAB — TROPONIN I (HIGH SENSITIVITY)
Troponin I (High Sensitivity): 11 ng/L (ref <18)
Troponin I (High Sensitivity): 13 ng/L (ref <18)

## 2023-10-17 LAB — BRAIN NATRIURETIC PEPTIDE: B Natriuretic Peptide: 293.6 pg/mL — ABNORMAL HIGH (ref 0.0–100.0)

## 2023-10-17 LAB — URINALYSIS, ROUTINE W REFLEX MICROSCOPIC
Bacteria, UA: NONE SEEN
Bilirubin Urine: NEGATIVE
Glucose, UA: >=500 mg/dL — AB
Hgb urine dipstick: NEGATIVE
Ketones, ur: NEGATIVE mg/dL
Leukocytes,Ua: NEGATIVE
Nitrite: NEGATIVE
Protein, ur: 30 mg/dL — AB
Specific Gravity, Urine: 1.026 (ref 1.005–1.030)
pH: 5 (ref 5.0–8.0)

## 2023-10-17 LAB — PROCALCITONIN: Procalcitonin: <0.1 ng/mL

## 2023-10-17 LAB — LIPASE, BLOOD: Lipase: 24 U/L (ref 11–51)

## 2023-10-17 LAB — GLUCOSE, CAPILLARY: Glucose-Capillary: 109 mg/dL — ABNORMAL HIGH (ref 70–99)

## 2023-10-17 LAB — HEMOGLOBIN A1C
Hgb A1c MFr Bld: 6.2 % — ABNORMAL HIGH (ref 4.8–5.6)
Mean Plasma Glucose: 131.24 mg/dL

## 2023-10-17 LAB — CBG MONITORING, ED: Glucose-Capillary: 241 mg/dL — ABNORMAL HIGH (ref 70–99)

## 2023-10-17 MED ORDER — SODIUM CHLORIDE 0.9 % IV SOLN
2.0000 g | Freq: Once | INTRAVENOUS | Status: AC
  Administered 2023-10-17: 2 g via INTRAVENOUS
  Filled 2023-10-17: qty 20

## 2023-10-17 MED ORDER — ACETAMINOPHEN 650 MG RE SUPP: 325.0000 mg | Freq: Four times a day (QID) | RECTAL | Status: DC | PRN

## 2023-10-17 MED ORDER — METOPROLOL SUCCINATE ER 25 MG PO TB24
12.5000 mg | ORAL_TABLET | Freq: Every day | ORAL | Status: DC
  Administered 2023-10-17 – 2023-10-18 (×2): 12.5 mg via ORAL
  Filled 2023-10-17 (×2): qty 1

## 2023-10-17 MED ORDER — ONDANSETRON HCL 4 MG PO TABS: 4.0000 mg | ORAL_TABLET | Freq: Four times a day (QID) | ORAL | Status: DC | PRN

## 2023-10-17 MED ORDER — ONDANSETRON HCL 4 MG/2ML IJ SOLN: 4.0000 mg | Freq: Four times a day (QID) | INTRAMUSCULAR | Status: DC | PRN

## 2023-10-17 MED ORDER — GUAIFENESIN ER 600 MG PO TB12
600.0000 mg | ORAL_TABLET | Freq: Two times a day (BID) | ORAL | Status: DC
  Administered 2023-10-17 – 2023-10-21 (×8): 600 mg via ORAL
  Filled 2023-10-17 (×8): qty 1

## 2023-10-17 MED ORDER — MAGNESIUM SULFATE 2 GM/50ML IV SOLN
2.0000 g | Freq: Once | INTRAVENOUS | Status: AC
  Administered 2023-10-17: 2 g via INTRAVENOUS
  Filled 2023-10-17: qty 50

## 2023-10-17 MED ORDER — SODIUM CHLORIDE 0.9 % IV BOLUS
1000.0000 mL | Freq: Once | INTRAVENOUS | Status: AC
  Administered 2023-10-17: 1000 mL via INTRAVENOUS

## 2023-10-17 MED ORDER — SODIUM CHLORIDE 0.9 % IV SOLN: 1.0000 g | INTRAVENOUS | Status: DC

## 2023-10-17 MED ORDER — ENOXAPARIN SODIUM 30 MG/0.3ML IJ SOSY
30.0000 mg | PREFILLED_SYRINGE | INTRAMUSCULAR | Status: DC
  Administered 2023-10-17 – 2023-10-18 (×2): 30 mg via SUBCUTANEOUS
  Filled 2023-10-17 (×2): qty 0.3

## 2023-10-17 MED ORDER — MORPHINE SULFATE (PF) 2 MG/ML IV SOLN
2.0000 mg | Freq: Once | INTRAVENOUS | Status: AC
  Administered 2023-10-17: 2 mg via INTRAVENOUS
  Filled 2023-10-17: qty 1

## 2023-10-17 MED ORDER — INSULIN ASPART 100 UNIT/ML IJ SOLN
0.0000 [IU] | Freq: Three times a day (TID) | INTRAMUSCULAR | Status: DC
  Administered 2023-10-18: 1 [IU] via SUBCUTANEOUS
  Filled 2023-10-17: qty 1

## 2023-10-17 MED ORDER — ACETAMINOPHEN 325 MG PO TABS
325.0000 mg | ORAL_TABLET | Freq: Four times a day (QID) | ORAL | Status: DC | PRN
  Administered 2023-10-18 – 2023-10-21 (×4): 325 mg via ORAL
  Filled 2023-10-17 (×4): qty 1

## 2023-10-17 MED ORDER — SODIUM CHLORIDE 0.9 % IV SOLN
500.0000 mg | INTRAVENOUS | Status: DC
  Administered 2023-10-18: 500 mg via INTRAVENOUS
  Filled 2023-10-17 (×2): qty 5

## 2023-10-17 MED ORDER — ASPIRIN 81 MG PO TBEC
81.0000 mg | DELAYED_RELEASE_TABLET | Freq: Every day | ORAL | Status: DC
  Administered 2023-10-18 – 2023-10-21 (×4): 81 mg via ORAL
  Filled 2023-10-17 (×4): qty 1

## 2023-10-17 MED ORDER — MIRTAZAPINE 15 MG PO TABS
7.5000 mg | ORAL_TABLET | Freq: Every day | ORAL | Status: DC
  Administered 2023-10-18 – 2023-10-20 (×3): 7.5 mg via ORAL
  Filled 2023-10-17 (×4): qty 1

## 2023-10-17 MED ORDER — SODIUM CHLORIDE 0.9 % IV SOLN
500.0000 mg | Freq: Once | INTRAVENOUS | Status: AC
  Administered 2023-10-17: 500 mg via INTRAVENOUS
  Filled 2023-10-17: qty 5

## 2023-10-17 MED ORDER — PANTOPRAZOLE SODIUM 40 MG PO TBEC
40.0000 mg | DELAYED_RELEASE_TABLET | Freq: Every day | ORAL | Status: DC
Start: 2023-10-17 — End: 2023-10-21
  Administered 2023-10-17 – 2023-10-21 (×5): 40 mg via ORAL
  Filled 2023-10-17 (×5): qty 1

## 2023-10-17 NOTE — H&P (Signed)
 History and Physical    Patient: Rodney Navarro FMW:[id number] DOB: May 30, 1924 DOA: 10/17/2023 DOS: the patient was seen and examined on 10/17/2023 PCP: [name], MD  Patient coming from: Home  Chief Complaint:  Chief Complaint  Patient presents with   Chest Pain   HPI: Rodney Navarro is a 89 y.o. male with medical history significant of CAD, history of cardiac cath and angioplasty, paroxysmal atrial fibrillation, hypertension, hyperlipidemia, type 2 diabetes, esophagitis, lumbar DDD, lumbar radiculitis, history of nonmelanoma skin cancer who presented to the emergency department with complaints of chest pain, dyspnea and increase in productive cough.  When EMS arrived he had atrial fibrillation with RVR at 130 bpm.  He received fentanyl 50 mcg, normal saline 1000 mL liter bolus and 4 mg of ondansetron .  His CBG was 358 mg/dL. He denied fever, chills, rhinorrhea, sore throat, wheezing or hemoptysis.  He has been having pleuritic chest pain on the lower right side, no palpitations, diaphoresis, PND, orthopnea or pitting edema of the lower extremities.  No abdominal pain, nausea, emesis, diarrhea, constipation, melena or hematochezia.  No flank pain, dysuria, frequency or hematuria.  No polyuria, polydipsia, polyphagia or blurred vision.   Lab work: CBC showed white count of 13.1, hemoglobin 10.1 g/dL and platelets 808.  Troponin and lipase were normal.  BNP 293.6 pg/mL.  CMP showed normal electrolytes after calcium correction, normal renal function, glucose 271 mg/dL.  Total protein was 5.9 and albumin 2.6 g/dL, the rest of the LFTs were normal.  Imaging: Portable 1 view chest radiograph is showing a new opacity in the left lung base with a tiny left effusion.  Infiltrate or pneumonia is possible.  Follow-up recommended to ensure resolution.   ED course: Initial vital signs were temperature 98.8 F, pulse 118, respirations 22, BP 170/82 mmHg O2 sat 98% on room air.  The patient received another  1000 mL of normal saline bolus, ceftriaxone  2 g IVPB and azithromycin  500 mg IVPB.  I added magnesium  sulfate 2 g IVPB.  Review of Systems: As mentioned in the history of present illness. All other systems reviewed and are negative.  Past Medical History:  Diagnosis Date   A-fib (HCC)    Diabetes mellitus without complication (HCC)    Hyperlipidemia    Hypertension    Osteoporosis    Past Surgical History:  Procedure Laterality Date   CARDIAC CATHETERIZATION     CHOLECYSTECTOMY     excision skin cancer left forearm  03/31/2019   SHOULDER ARTHROCENTESIS Right    Social History:  reports that he has quit smoking. His smoking use included cigarettes. He has never used smokeless tobacco. He reports that he does not drink alcohol  and does not use drugs.  Allergies  Allergen Reactions   Alendronate Other (See Comments)   Risedronate Other (See Comments)   Tetanus Toxoids     Family History  Problem Relation Age of Onset   Heart disease Father     Prior to Admission medications   Medication Sig Start Date End Date Taking? Authorizing Provider  acetaminophen  (TYLENOL ) 500 MG tablet Take 500 mg by mouth daily.    [provider]  ascorbic acid  (VITAMIN C ) 1000 MG tablet Take 500 mg by mouth daily.    [provider]  aspirin  (GOODSENSE ASPIRIN ) 81 MG chewable tablet Chew 81 mg by mouth daily.    [provider]  Cholecalciferol (VITAMIN D) 2000 units tablet Take 2,000 Units by mouth daily. 06/20/14   [provider]  Cyanocobalamin  (RA VITAMIN B-12 TR) 1000 MCG TBCR Take 1,000 mcg by mouth daily. 06/20/14   [provider]  fluticasone  (FLONASE ) 50 MCG/ACT nasal spray Place 2 sprays into both nostrils daily. 11/05/14   [provider]  Melatonin 3 MG TABS Take by mouth.    [provider]  methylPREDNISolone  (MEDROL  DOSEPAK) 4 MG TBPK tablet Take as Directed, per package instructions 02/24/23   Claudene Penne ORN, MD   metoprolol  succinate (TOPROL -XL) 50 MG 24 hr tablet Take 50 mg by mouth daily. 06/20/14   [provider]  Multiple Vitamin (MULTI-VITAMINS) TABS Take 1 tablet by mouth daily.    [provider]  pantoprazole  (PROTONIX ) 40 MG tablet Take 40 mg by mouth daily. 06/15/22   [provider]  traZODone  (DESYREL ) 50 MG tablet Take 50 mg by mouth at bedtime. 06/15/22   [provider]    Physical Exam: Vitals:   10/17/23 1535 10/17/23 1558 10/17/23 1600 10/17/23 1605  BP:    (!) 142/68  Pulse:  99 100   Resp:    16  Temp: 98.8 F (37.1 C)     TempSrc: Oral     SpO2:    99%  Weight:      Height:       Physical Exam Vitals and nursing note reviewed.  Constitutional:      General: He is awake. He is not in acute distress.    Appearance: He is well-developed. He is ill-appearing.  HENT:     Head: Normocephalic.     Nose: No rhinorrhea.     Mouth/Throat:     Mouth: Mucous membranes are moist.  Eyes:     General: No scleral icterus.    Pupils: Pupils are equal, round, and reactive to light.  Neck:     Vascular: No JVD.  Cardiovascular:     Rate and Rhythm: Normal rate. Rhythm irregular.     Heart sounds: S1 normal and S2 normal.  Pulmonary:     Breath sounds: Rhonchi and rales present. No wheezing.  Abdominal:     General: Bowel sounds are normal. There is no distension.     Palpations: Abdomen is soft.     Tenderness: There is no abdominal tenderness. There is no right CVA tenderness or left CVA tenderness.  Musculoskeletal:     Cervical back: Neck supple.     Right lower leg: Edema present.     Left lower leg: Edema present.  Skin:    General: Skin is warm and dry.     Findings: Bruising present.  Neurological:     General: No focal deficit present.     Mental Status: He is alert and oriented to person, place, and time.  Psychiatric:        Mood and Affect: Mood normal.        Behavior: Behavior normal. Behavior is cooperative.      Data Reviewed:  Results are pending, will review when available. 10/18/2015 echocardiogram report. Indications:      Syncope 780.2.   -------------------------------------------------------------------  Study Conclusions   - Left ventricle: There was moderate concentric hypertrophy.  - Aortic valve: There was trivial regurgitation. Valve area (Vmax):    2.28 cm^2.  - Mitral valve: There was mild regurgitation.   EKG: Vent. rate 102 BPM PR interval * ms QRS duration 100 ms QT/QTcB 316/412 ms P-R-T axes * 82 -27 Atrial fibrillation Ventricular premature complex Left ventricular hypertrophy Anterior infarct, old Nonspecific  T abnormalities, inferior leads  Assessment and Plan: Principal Problem:   CAP (community acquired pneumonia) Admit to telemetry/inpatient. Continue supplemental oxygen. Scheduled and as needed bronchodilators. Continue ceftriaxone  1 g IVPB daily. Continue azithromycin  500 mg IVPB daily. Check procalcitonin level. Check strep pneumoniae urinary antigen. Check sputum Gram stain, culture and sensitivity. Follow-up blood culture and sensitivity. Follow-up CBC and chemistry in the morning.  Active Problems:   Paroxysmal atrial fibrillation with RVR (HCC) CHA?DS?-VASc of at least 5, pending echocardiogram results Anticoagulation deferred due to age and anemia. Begin metoprolol  succinate 12.5 mg p.o. daily. Keep electrolytes optimized.    Elevated brain natriuretic peptide (BNP) level We will obtain an echocardiogram.    Type 2 diabetes mellitus with hyperglycemia (HCC) Carbohydrate modified diet. CBG monitoring with RI SS. Check hemoglobin A1c.    Underweight (BMI < 18.5) Has taken trazodone  for insomnia. Will begin mirtazapine  7.5 mg p.o. nightly. Consider nutritional services evaluation in AM.    Moderate protein malnutrition (HCC) In the setting of anemia and acute illness. May benefit from protein supplementation. Consider  nutritional services evaluation. Follow-up albumin level in the morning.    Essential hypertension Started metoprolol  succinate 12.5 mg p.o. daily.    Hyperlipidemia Currently not on therapy. Follow-up with primary care provider.    Normocytic anemia Monitor hematocrit hemoglobin.     Advance Care Planning:   Code Status: Limited: Do not attempt resuscitation (DNR) -DNR-LIMITED -Do Not Intubate/DNI    Consults:   Family Communication: Her son and daughter-in-law were present in the emergency department.  Severity of Illness: The appropriate patient status for this patient is OBSERVATION. Observation status is judged to be reasonable and necessary in order to provide the required intensity of service to ensure the patient's safety. The patient's presenting symptoms, physical exam findings, and initial radiographic and laboratory data in the context of their medical condition is felt to place them at decreased risk for further clinical deterioration. Furthermore, it is anticipated that the patient will be medically stable for discharge from the hospital within 2 midnights of admission.   Author: Alm Dorn Castor, MD 10/17/2023 5:17 PM  For on call review [email or web address].   This document was prepared using Dragon voice recognition software and may contain some unintended transcription errors.

## 2023-10-17 NOTE — ED Notes (Signed)
 CCMD notified  of pt on cardiac monitor and room change.

## 2023-10-17 NOTE — ED Triage Notes (Signed)
 ARRIVES FROM HOME. Chest pain, patient fell earlier last sunday. Patient has a chest cold and productive cough. 50mcg fent for pain. A-fib RVR rate of 130. 1L of NS from EMS. 4mg  Zofran . CBG: 358.  158/78, 120, 97%RA.  Patient AOX4 at this moment.

## 2023-10-17 NOTE — ED Provider Notes (Signed)
 [facility] Provider Note    Event Date/Time   First MD Initiated Contact with Patient 10/17/23 1529     (approximate)   History   Chest Pain   HPI  Rodney Navarro is a 89 y.o. male who presents to the ED for evaluation of Chest Pain   Reviewed PCP visit from 7/2.  History of CAD, A-fib, DM, HTN.  Aspirin  and PPI but no anticoagulation prescribed.  Protein calorie malnutrition  Patient presents from home via EMS for evaluation of progressive fatigue, chest discomfort and cough after a fall that occurred 1 week ago.  He reports mechanical fall last Sunday, 1 week ago, causing him to fall and strike his right sided rib cage and left-sided forearm.  Skin tear on the forearm.  No head injury or syncope.  He was at home independently and has a daughter/son-in-law who check in on him.  Since then he has had increasing fatigue, cough, intermittent chest discomfort and malaise   Physical Exam   Triage Vital Signs: ED Triage Vitals  Encounter Vitals Group     BP      Girls Systolic BP Percentile      Girls Diastolic BP Percentile      Boys Systolic BP Percentile      Boys Diastolic BP Percentile      Pulse      Resp      Temp      Temp src      SpO2      Weight      Height      Head Circumference      Peak Flow      Pain Score      Pain Loc      Pain Education      Exclude from Growth Chart     Most recent vital signs: Vitals:   10/17/23 1600 10/17/23 1605  BP:  (!) 142/68  Pulse: 100   Resp:  16  Temp:    SpO2:  99%    General: Awake, no distress.  Hard of hearing but pleasant and conversational. CV:  Good peripheral perfusion.  A-fib, intermittently with RVR Resp:  Normal effort.  No distress or tachypnea Abd:  No distention.  Soft MSK:  No deformity noted.  Superficial skin tear on the left forearm.  Palpation of all 4 extremities otherwise without evidence of deformity, tenderness or trauma.  Some mild lateral right sided chest wall  tenderness, no abdominal tenderness Neuro:  No focal deficits appreciated. Other:     ED Results / Procedures / Treatments   Labs (all labs ordered are listed, but only abnormal results are displayed) Labs Reviewed  COMPREHENSIVE METABOLIC PANEL WITH GFR - Abnormal; Notable for the following components:      Result Value   Glucose, Bld 271 (*)    Calcium 8.7 (*)    Total Protein 5.9 (*)    Albumin 2.6 (*)    All other components within normal limits  CBC WITH DIFFERENTIAL/PLATELET - Abnormal; Notable for the following components:   WBC 13.1 (*)    RBC 3.20 (*)    Hemoglobin 10.1 (*)    HCT 30.8 (*)    Neutro Abs 11.3 (*)    Abs Immature Granulocytes 0.15 (*)    All other components within normal limits  BRAIN NATRIURETIC PEPTIDE - Abnormal; Notable for the following components:   B Natriuretic Peptide 293.6 (*)    All other components  within normal limits  CBG MONITORING, ED - Abnormal; Notable for the following components:   Glucose-Capillary 241 (*)    All other components within normal limits  CULTURE, BLOOD (ROUTINE X 2)  CULTURE, BLOOD (ROUTINE X 2)  MAGNESIUM   LIPASE, BLOOD  URINALYSIS, ROUTINE W REFLEX MICROSCOPIC  PROCALCITONIN  LACTIC ACID, PLASMA  LACTIC ACID, PLASMA  TROPONIN I (HIGH SENSITIVITY)  TROPONIN I (HIGH SENSITIVITY)    EKG A-fib with RVR, rate of 102 bpm.  Signs of LVH.  Treatment is baseline.  No STEMI.  RADIOLOGY 1 view CXR interpreted by me with left-sided basilar opacity  Official radiology report(s): DG Chest Portable 1 View Result Date: 10/17/2023 CLINICAL DATA:  Productive cough EXAM: PORTABLE CHEST 1 VIEW COMPARISON:  X-ray 09/06/2022 FINDINGS: New area of consolidative ill-defined opacity at the left lung base with tiny left effusion. Hyperinflation. No pneumothorax or edema. Normal cardiopericardial silhouette. Calcified aorta. Overlapping cardiac leads. Osteopenia. IMPRESSION: New opacity left lung base with a tiny left effusion.  Infiltrate or pneumonia is possible. Recommend follow-up to confirm clearance. Electronically Signed   By: Ranell Bring M.D.   On: 10/17/2023 16:22    PROCEDURES and INTERVENTIONS:  .1-3 Lead EKG Interpretation  Performed by: Claudene Rover, MD Authorized by: Claudene Rover, MD     Interpretation: abnormal     ECG rate:  121   ECG rate assessment: tachycardic     Rhythm: atrial fibrillation     Ectopy: none     Conduction: normal   .Critical Care  Performed by: Claudene Rover, MD Authorized by: Claudene Rover, MD   Critical care provider statement:    Critical care time (minutes):  30   Critical care time was exclusive of:  Separately billable procedures and treating other patients   Critical care was necessary to treat or prevent imminent or life-threatening deterioration of the following conditions:  Sepsis   Critical care was time spent personally by me on the following activities:  Development of treatment plan with patient or surrogate, discussions with consultants, evaluation of patient's response to treatment, examination of patient, ordering and review of laboratory studies, ordering and review of radiographic studies, ordering and performing treatments and interventions, pulse oximetry, re-evaluation of patient's condition and review of old charts   Medications  cefTRIAXone  (ROCEPHIN ) 2 g in sodium chloride  0.9 % 100 mL IVPB (2 g Intravenous New Bag/Given 10/17/23 1658)  azithromycin  (ZITHROMAX ) 500 mg in sodium chloride  0.9 % 250 mL IVPB (has no administration in time range)  magnesium  sulfate IVPB 2 g 50 mL (has no administration in time range)  sodium chloride  0.9 % bolus 1,000 mL (1,000 mLs Intravenous New Bag/Given 10/17/23 1658)  morphine  (PF) 2 MG/ML injection 2 mg (2 mg Intravenous Given 10/17/23 1655)     IMPRESSION / MDM / ASSESSMENT AND PLAN / ED COURSE  I reviewed the triage vital signs and the nursing notes.  Differential diagnosis includes, but is not limited to,  ACS, PTX, PNA, muscle strain/spasm, PE, dissection, anxiety, pleural effusion  {Patient presents with symptoms of an acute illness or injury that is potentially life-threatening.  Patient presents from home via EMS with signs of sepsis from pneumonia precipitating A-fib with RVR.  History of A-fib without anticoagulation.  No pneumothorax or obvious rib fractures on chest x-ray but development of pneumonia since this injury 1 week ago.  Signs of sepsis with rapid heart rate and leukocytosis.  Will draw cultures and provide CAP coverage antibiotics.  Will consult medicine for  admission.  Clinical Course as of 10/17/23 ARDELLA Repress Oct 17, 2023  1529 Fall 1 week ago, chest pain today. Afib, hypergly, cough [DS]  1706 Reassessed and discussed pneumonia and admission.  Daughter and son-in-law at the bedside. [DS]  1719 Consult with hospitalist who agrees to admit [DS]    Clinical Course User Index [DS] Claudene Rover, MD     FINAL CLINICAL IMPRESSION(S) / ED DIAGNOSES   Final diagnoses:  Sepsis due to pneumonia [facility])  Atrial fibrillation with RVR (HCC)     Rx / DC Orders   ED Discharge Orders     None        Note:  This document was prepared using Dragon voice recognition software and may include unintentional dictation errors.   Claudene Rover, MD 10/17/23 [phone]

## 2023-10-17 NOTE — ED Notes (Signed)
 Patent provided urinal at bedside. Denies need to urinate at this time.

## 2023-10-18 ENCOUNTER — Inpatient Hospital Stay
Admit: 2023-10-18 | Discharge: 2023-10-18 | Disposition: A | Discharge status: Home / Self Care | Attending: Internal Medicine | Admitting: Internal Medicine

## 2023-10-18 ENCOUNTER — Encounter: Payer: Self-pay | Admitting: Internal Medicine

## 2023-10-18 DIAGNOSIS — J189 Pneumonia, unspecified organism: Secondary | ICD-10-CM | POA: Diagnosis not present

## 2023-10-18 LAB — STREP PNEUMONIAE URINARY ANTIGEN: Strep Pneumo Urinary Antigen: NEGATIVE

## 2023-10-18 LAB — CBC
HCT: 30 % — ABNORMAL LOW (ref 39.0–52.0)
Hemoglobin: 9.6 g/dL — ABNORMAL LOW (ref 13.0–17.0)
MCH: 31.4 pg (ref 26.0–34.0)
MCHC: 32 g/dL (ref 30.0–36.0)
MCV: 98 fL (ref 80.0–100.0)
Platelets: 183 K/uL (ref 150–400)
RBC: 3.06 MIL/uL — ABNORMAL LOW (ref 4.22–5.81)
RDW: 14.6 % (ref 11.5–15.5)
WBC: 9.3 K/uL (ref 4.0–10.5)
nRBC: 0 % (ref 0.0–0.2)

## 2023-10-18 LAB — ECHOCARDIOGRAM COMPLETE
AR max vel: 1.8 cm2
AV Area VTI: 1.71 cm2
AV Area mean vel: 1.71 cm2
AV Mean grad: 2 mmHg
AV Peak grad: 3.4 mmHg
Ao pk vel: 0.92 m/s
Area-P 1/2: 4.19 cm2
Height: 68 in
S' Lateral: 2.1 cm
Weight: 1854.4 [oz_av]

## 2023-10-18 LAB — GLUCOSE, CAPILLARY
Glucose-Capillary: 83 mg/dL (ref 70–99)
Glucose-Capillary: 130 mg/dL — ABNORMAL HIGH (ref 70–99)
Glucose-Capillary: 83 mg/dL (ref 70–99)
Glucose-Capillary: 141 mg/dL — ABNORMAL HIGH (ref 70–99)

## 2023-10-18 LAB — COMPREHENSIVE METABOLIC PANEL WITH GFR
ALT: 41 U/L (ref 0–44)
AST: 38 U/L (ref 15–41)
Albumin: 2.4 g/dL — ABNORMAL LOW (ref 3.5–5.0)
Alkaline Phosphatase: 65 U/L (ref 38–126)
Anion gap: 11 (ref 5–15)
BUN: 12 mg/dL (ref 8–23)
CO2: 25 mmol/L (ref 22–32)
Calcium: 8.6 mg/dL — ABNORMAL LOW (ref 8.9–10.3)
Chloride: 103 mmol/L (ref 98–111)
Creatinine, Ser: 0.63 mg/dL (ref 0.61–1.24)
GFR, Estimated: >60 mL/min (ref >60)
Glucose, Bld: 102 mg/dL — ABNORMAL HIGH (ref 70–99)
Potassium: 4 mmol/L (ref 3.5–5.1)
Sodium: 139 mmol/L (ref 135–145)
Total Bilirubin: 0.7 mg/dL (ref 0.0–1.2)
Total Protein: 5 g/dL — ABNORMAL LOW (ref 6.5–8.1)

## 2023-10-18 LAB — PROCALCITONIN: Procalcitonin: <0.1 ng/mL

## 2023-10-18 MED ORDER — CARBOXYMETHYLCELLUL-GLYCERIN 0.5-0.9 % OP SOLN: 1.0000 [drp] | Freq: Four times a day (QID) | OPHTHALMIC | Status: DC

## 2023-10-18 MED ORDER — TRAZODONE HCL 50 MG PO TABS
50.0000 mg | ORAL_TABLET | Freq: Every day | ORAL | Status: DC
  Administered 2023-10-18 – 2023-10-19 (×2): 50 mg via ORAL
  Filled 2023-10-18 (×2): qty 1

## 2023-10-18 MED ORDER — ADULT MULTIVITAMIN W/MINERALS CH
1.0000 | ORAL_TABLET | Freq: Every day | ORAL | Status: DC
  Administered 2023-10-18 – 2023-10-21 (×4): 1 via ORAL
  Filled 2023-10-18 (×4): qty 1

## 2023-10-18 MED ORDER — FLUTICASONE PROPIONATE 50 MCG/ACT NA SUSP
2.0000 | Freq: Every day | NASAL | Status: DC
  Administered 2023-10-19 – 2023-10-21 (×3): 2 via NASAL
  Filled 2023-10-18 (×2): qty 16

## 2023-10-18 MED ORDER — MELATONIN 5 MG PO TABS
5.0000 mg | ORAL_TABLET | Freq: Every day | ORAL | Status: DC
  Administered 2023-10-18 – 2023-10-20 (×3): 5 mg via ORAL
  Filled 2023-10-18 (×3): qty 1

## 2023-10-18 MED ORDER — SODIUM CHLORIDE 0.9 % IV SOLN
1.0000 g | INTRAVENOUS | Status: DC
  Administered 2023-10-18 – 2023-10-20 (×3): 1 g via INTRAVENOUS
  Filled 2023-10-18 (×4): qty 10

## 2023-10-18 MED ORDER — METOPROLOL SUCCINATE ER 50 MG PO TB24
50.0000 mg | ORAL_TABLET | Freq: Every day | ORAL | Status: DC
  Administered 2023-10-19 – 2023-10-21 (×3): 50 mg via ORAL
  Filled 2023-10-18 (×3): qty 1

## 2023-10-18 MED ORDER — ENSURE PLUS HIGH PROTEIN PO LIQD
237.0000 mL | Freq: Two times a day (BID) | ORAL | Status: DC
  Administered 2023-10-19 (×2): 237 mL via ORAL

## 2023-10-18 MED ORDER — POLYVINYL ALCOHOL 1.4 % OP SOLN
1.0000 [drp] | Freq: Four times a day (QID) | OPHTHALMIC | Status: DC | PRN
  Administered 2023-10-19: 1 [drp] via OPHTHALMIC
  Filled 2023-10-18: qty 15

## 2023-10-18 NOTE — Evaluation (Signed)
 Occupational Therapy Evaluation Patient Details Name: Rodney Navarro MRN: [id number] DOB: [birth year] Today's Date: 10/18/2023   History of Present Illness   Pt is a 89 y.o. male who presented to the ED with complaints of chest pain, dyspnea and increase in productive cough, a-fib with RVR. Admitted for management of CAP, A-fib. PMH of CAD, history of cardiac cath and angioplasty, paroxysmal atrial fibrillation, hypertension, hyperlipidemia, type 2 diabetes, esophagitis, lumbar DDD, lumbar radiculitis, history of nonmelanoma skin cancer.     Clinical Impressions Pt was seen for OT evaluation this date. PTA, pt resides in a one level home with 1 STE on his own. His daughter and son in law check on him daily. He is mod I with all tasks using SPC or rollator for ambulation, continues to drive out to eat and has neighbors bring in food or family assist with laundry.  Pt presents to acute OT demonstrating impaired ADL performance and functional mobility 2/2 mild weakness/balance deficits limited his safety during ADL performance. He reports soreness all over from his fall last week. Pt currently requires MOD I for bed mobility,  CGA for STS and SBA for standing grooming tasks at the sink. Pt opted to utilize [facility] for in room ambulation ~21ft around the bed to the recliner with CGA progressing to SBA. Pt would benefit from skilled OT services to address noted impairments and functional limitations to maximize safety and independence while minimizing falls risk and caregiver burden. Do anticipate the need for follow up HHOT services upon acute hospital DC to maximize safety upon return home.      If plan is discharge home, recommend the following:   A little help with walking and/or transfers;A little help with bathing/dressing/bathroom;Help with stairs or ramp for entrance     Functional Status Assessment   Patient has had a recent decline in their functional status and demonstrates the ability to  make significant improvements in function in a reasonable and predictable amount of time.     Equipment Recommendations   None recommended by OT     Recommendations for Other Services         Precautions/Restrictions   Precautions Precautions: Fall Recall of Precautions/Restrictions: Intact Restrictions Weight Bearing Restrictions Per Provider Order: No     Mobility Bed Mobility Overal bed mobility: Modified Independent             General bed mobility comments: increased time/effort    Transfers Overall transfer level: Needs assistance Equipment used: Rolling walker (2 wheels) Transfers: Sit to/from Stand Sit to Stand: Contact guard assist           General transfer comment: increased time/effort to stand from EOB to RW, able to stand at sink for face washing with supervision and then pushed RW out the way and asked to use SPC; ambulated around the bed to the recliner using SPC with SBA throughout      Balance Overall balance assessment: Needs assistance Sitting-balance support: Feet supported Sitting balance-Leahy Scale: Good     Standing balance support: Single extremity supported Standing balance-Leahy Scale: Fair Standing balance comment: CGA progressing to SBA with SPC use around the bed                           ADL either performed or assessed with clinical judgement   ADL Overall ADL's : Needs assistance/impaired     Grooming: Wash/dry face;Standing;Supervision/safety  Functional mobility during ADLs: Contact guard assist;Supervision/safety;Cane       Vision         Perception         Praxis         Pertinent Vitals/Pain Pain Assessment Pain Assessment: 0-10 Pain Score: 2  Pain Location: all over Pain Descriptors / Indicators: Sore Pain Intervention(s): Monitored during session, Repositioned     Extremity/Trunk Assessment Upper Extremity Assessment Upper  Extremity Assessment: Overall WFL for tasks assessed   Lower Extremity Assessment Lower Extremity Assessment: Generalized weakness       Communication Communication Communication: Impaired Factors Affecting Communication: Hearing impaired   Cognition Arousal: Alert Behavior During Therapy: WFL for tasks assessed/performed Cognition: No apparent impairments             OT - Cognition Comments: A and O x4                 Following commands: Intact       Cueing  General Comments   Cueing Techniques: Verbal cues  HR 110 max   Exercises Other Exercises Other Exercises: Edu on role of OT in acute setting.   Shoulder Instructions      Home Living Family/patient expects to be discharged to:: Private residence Living Arrangements: Alone Available Help at Discharge: Family;Available PRN/intermittently (daughter and son in law come by daily) Type of Home: House Home Access: Stairs to enter [employer] of Steps: 1 on the front with bil HR and 1 on the back Entrance Stairs-Rails: Right;Left;Can reach both Home Layout: One level     Bathroom Shower/Tub: Producer, television/film/video: Handicapped height     Home Equipment: Hand held shower head;Shower seat;Toilet riser;Cane - single point;Rollator (4 wheels)   Additional Comments: reports he does not use shower chair      Prior Functioning/Environment Prior Level of Function : History of Falls (last six months);Independent/Modified Independent;Driving             Mobility Comments: uses a SPC or rollator indoors and rollator outdoors walking in his driveway daily ADLs Comments: MOD I with ADLs; pt does his own laundry; granddaughter washses his linens/sheets; he does not cook, he drives to eat out, neighbors bring him food    OT Problem List: Impaired balance (sitting and/or standing);Pain   OT Treatment/Interventions: Self-care/ADL training;Therapeutic exercise;Patient/family  education;Therapeutic activities;DME and/or AE instruction      OT Goals(Current goals can be found in the care plan section)   Acute Rehab OT Goals Patient Stated Goal: go home OT Goal Formulation: With patient Time For Goal Achievement: 11/01/23 Potential to Achieve Goals: Good ADL Goals Pt Will Perform Lower Body Bathing: sit to/from stand;sitting/lateral leans;with modified independence Pt Will Perform Lower Body Dressing: with modified independence;sit to/from stand;sitting/lateral leans Pt Will Transfer to Toilet: with supervision;with modified independence;regular height toilet;ambulating   OT Frequency:  Min 2X/week    Co-evaluation              AM-PAC OT 6 Clicks Daily Activity     Outcome Measure Help from another person eating meals?: None Help from another person taking care of personal grooming?: None Help from another person toileting, which includes using toliet, bedpan, or urinal?: A Little Help from another person bathing (including washing, rinsing, drying)?: A Little Help from another person to put on and taking off regular upper body clothing?: None Help from another person to put on and taking off regular lower body clothing?: A Little 6 Click  Score: 21   End of Session Equipment Utilized During Treatment: Gait belt;Rolling walker (2 wheels) [facility]) Nurse Communication: Mobility status  Activity Tolerance: Patient tolerated treatment well Patient left: in chair;with call bell/phone within reach;with chair alarm set  OT Visit Diagnosis: Other abnormalities of gait and mobility (R26.89);Unsteadiness on feet (R26.81);Muscle weakness (generalized) (M62.81);Repeated falls (R29.6)                Time: 9165-9140 OT Time Calculation (min): 25 min Charges:  OT General Charges $OT Visit: 1 Visit OT Evaluation $OT Eval Moderate Complexity: 1 Mod OT Treatments $Self Care/Home Management : 8-22 mins  Rodney Navarro, OTR/L  10/18/23, 12:23 PM   Rodney Sublett E  Vertie Navarro 10/18/2023, 12:20 PM

## 2023-10-18 NOTE — Plan of Care (Signed)
  Problem: Fluid Volume: Goal: Ability to maintain a balanced intake and output will improve Outcome: Progressing   Problem: Metabolic: Goal: Ability to maintain appropriate glucose levels will improve Outcome: Progressing   Problem: Nutritional: Goal: Maintenance of adequate nutrition will improve Outcome: Progressing   Problem: Clinical Measurements: Goal: Respiratory complications will improve Outcome: Progressing   Problem: Education: Goal: Ability to describe self-care measures that may prevent or decrease complications (Diabetes Survival Skills Education) will improve Outcome: Not Progressing   Problem: Coping: Goal: Ability to adjust to condition or change in health will improve Outcome: Not Progressing   Problem: Health Behavior/Discharge Planning: Goal: Ability to identify and utilize available resources and services will improve Outcome: Not Progressing Goal: Ability to manage health-related needs will improve Outcome: Not Progressing   Problem: Skin Integrity: Goal: Risk for impaired skin integrity will decrease Outcome: Not Progressing   Problem: Education: Goal: Knowledge of General Education information will improve Description: Including pain rating scale, medication(s)/side effects and non-pharmacologic comfort measures Outcome: Not Progressing   Problem: Activity: Goal: Risk for activity intolerance will decrease Outcome: Not Progressing

## 2023-10-18 NOTE — Evaluation (Signed)
 Physical Therapy Evaluation Patient Details Name: Rodney Navarro MRN: [id number] DOB: 1924/08/18 Today's Date: 10/18/2023  History of Present Illness  Pt is a 89 y.o. male who presented to the ED with complaints of chest pain, dyspnea and increase in productive cough, a-fib with RVR. Admitted for management of CAP, A-fib. PMH of CAD, history of cardiac cath and angioplasty, paroxysmal atrial fibrillation, hypertension, hyperlipidemia, type 2 diabetes, esophagitis, lumbar DDD, lumbar radiculitis, history of nonmelanoma skin cancer.   Clinical Impression  Patient alert, agreeable to PT, seated in recliner, reported all over pain. Pt stated at baseline he is ambulatory in home with SPC/rollator per pt choice as needed. Reported 3 falls, 2 outside of the home while attempting lawncare. He was able to perform sit <> stand with CGA, did need two attempts to come up successfully but no true physical assistance needed. He was able to ambulate ~378ft with RW, improved cadence and gait velocity over time. No LOB. Pt appears to be nearing his baseline level of functioning but due to recent admission and recent falls, recommend skilled PT services to ensure safe mobility and maximize function.        If plan is discharge home, recommend the following: Assistance with cooking/housework;Assist for transportation;Help with stairs or ramp for entrance   Can travel by private vehicle        Equipment Recommendations None recommended by PT  Recommendations for Other Services       Functional Status Assessment Patient has had a recent decline in their functional status and demonstrates the ability to make significant improvements in function in a reasonable and predictable amount of time.     Precautions / Restrictions Precautions Precautions: Fall Recall of Precautions/Restrictions: Intact Restrictions Weight Bearing Restrictions Per Provider Order: No      Mobility  Bed Mobility                General bed mobility comments: pt up in recliner at start/end of session    Transfers Overall transfer level: Needs assistance Equipment used: Rolling walker (2 wheels) Transfers: Sit to/from Stand Sit to Stand: Contact guard assist                Ambulation/Gait Ambulation/Gait assistance: Supervision, Contact guard assist Gait Distance (Feet): 350 Feet Assistive device: Rolling walker (2 wheels)         General Gait Details: pt with improved gait velocity over time, able to ambulate loop twice. motivated to continue to mobilize. no LOB noted  Stairs            Wheelchair Mobility     Tilt Bed    Modified Rankin (Stroke Patients Only)       Balance Overall balance assessment: Needs assistance Sitting-balance support: Feet supported Sitting balance-Leahy Scale: Good       Standing balance-Leahy Scale: Fair                               Pertinent Vitals/Pain Pain Assessment Pain Assessment: 0-10 Pain Location: all over Pain Descriptors / Indicators: Sore Pain Intervention(s): Limited activity within patient's tolerance, Monitored during session, Repositioned    Home Living Family/patient expects to be discharged to:: Private residence Living Arrangements: Alone Available Help at Discharge: Family;Available PRN/intermittently (daughter and son in law come by daily) Type of Home: House Home Access: Stairs to enter Entrance Stairs-Rails: Right;Left;Can reach both Entrance Stairs-Number of Steps: 1 on the front with bil HR  and 1 on the back   Home Layout: One level Home Equipment: Hand held shower head;Shower seat;Toilet riser;Cane - single point;Rollator (4 wheels) Additional Comments: reports he does not use shower chair    Prior Function Prior Level of Function : History of Falls (last six months);Independent/Modified Independent;Driving             Mobility Comments: uses a SPC or rollator indoors and rollator outdoors  walking in his driveway daily ADLs Comments: MOD I with ADLs; pt does his own laundry; granddaughter washses his linens/sheets; he does not cook, he drives to eat out, neighbors bring him food     Extremity/Trunk Assessment   Upper Extremity Assessment Upper Extremity Assessment: Overall WFL for tasks assessed    Lower Extremity Assessment Lower Extremity Assessment: Generalized weakness       Communication   Communication Communication: Impaired Factors Affecting Communication: Hearing impaired    Cognition Arousal: Alert Behavior During Therapy: WFL for tasks assessed/performed   PT - Cognitive impairments: No apparent impairments                         Following commands: Intact       Cueing Cueing Techniques: Verbal cues     General Comments      Exercises     Assessment/Plan    PT Assessment Patient needs continued PT services  PT Problem List Decreased mobility;Decreased activity tolerance;Decreased balance       PT Treatment Interventions DME instruction;Balance training;Gait training;Neuromuscular re-education;Stair training;Functional mobility training;Patient/family education;Therapeutic activities;Therapeutic exercise    PT Goals (Current goals can be found in the Care Plan section)  Acute Rehab PT Goals Patient Stated Goal: to go home PT Goal Formulation: With patient Time For Goal Achievement: 11/01/23 Potential to Achieve Goals: Good    Frequency Min 2X/week     Co-evaluation               AM-PAC PT 6 Clicks Mobility  Outcome Measure Help needed turning from your back to your side while in a flat bed without using bedrails?: None Help needed moving from lying on your back to sitting on the side of a flat bed without using bedrails?: None Help needed moving to and from a bed to a chair (including a wheelchair)?: None Help needed standing up from a chair using your arms (e.g., wheelchair or bedside chair)?: A  Little Help needed to walk in hospital room?: None Help needed climbing 3-5 steps with a railing? : A Little 6 Click Score: 22    End of Session Equipment Utilized During Treatment: Gait belt Activity Tolerance: Patient tolerated treatment well Patient left: in chair;with call bell/phone within reach;with chair alarm set Nurse Communication: Mobility status PT Visit Diagnosis: Other abnormalities of gait and mobility (R26.89);Difficulty in walking, not elsewhere classified (R26.2);Muscle weakness (generalized) (M62.81)    Time: 8968-8953 PT Time Calculation (min) (ACUTE ONLY): 15 min   Charges:   PT Evaluation $PT Eval Low Complexity: 1 Low PT Treatments $Therapeutic Activity: 8-22 mins PT General Charges $$ ACUTE PT VISIT: 1 Visit        Doyal Shams PT, DPT 11:19 AM,10/18/23

## 2023-10-18 NOTE — Progress Notes (Signed)
*  PRELIMINARY RESULTS* Echocardiogram 2D Echocardiogram has been performed.  Floydene Harder 10/18/2023, 8:33 AM

## 2023-10-18 NOTE — Progress Notes (Signed)
 Progress Note   Patient: Rodney Navarro FMW:[id number] DOB: [birth year] DOA: 10/17/2023     1 DOS: the patient was seen and examined on 10/18/2023   Brief hospital course: 89yo with h/o CAD s/p angioplasty, afib, HTN, HLD, DM, and chronic back pain who presented on 7/20 with chest pain.  He was noted to be in afib with RVR to 130 and glucose was 358.  CXR with [facility] PNA. He was started on IVF and Ceftriaxone /Azithromycin .  Assessment and Plan:  CAP (community acquired pneumonia) Presented with chest pain and productive cough, found to be in aifb with LLL PNA Admitted to telemetry Stable on RA Scheduled and as needed bronchodilators Continue ceftriaxone /azithromycin  Negative blood cultures to date   Paroxysmal atrial fibrillation with RVR  Resume Toprol  XL for rate control Takes ASA at home ?not on AC due to fall risk (note from 2017 says no Eliquis due to patient referral because of cost) Echo with preserved EF, overall unremarkable Elevated BNP is likely related to this issue   Type 2 diabetes mellitus with hyperglycemia A1c 6.2, good control  Diet controlled at home Significantly elevated on arrival, ?stress response Will keep carb modified diet but stop checking glucose or covering with SSI   Underweight (BMI < 18.5) Has taken trazodone  for insomnia Added mirtazapine  7.5 mg p.o. nightly on admission Consult to nutritional services   Moderate protein malnutrition In the setting of anemia and acute illness May benefit from protein supplementation. Consult to nutritional services   Essential hypertension Continue Toprol  XL   Hyperlipidemia Currently not on therapy Follow-up with primary care provider   Normocytic anemia Monitor  Asymptomatic, no c/o bleeding      Consultants: PT OT  Procedures: Echocardiogram 7/21  Antibiotics: Azithromycin  7/20-25 Ceftriaxone  7/20-25  30 Day Unplanned Readmission Risk Score    Flowsheet Row ED to Hosp-Admission  (Current) from 10/17/2023 in [facility]  30 Day Unplanned Readmission Risk Score (%) 13.02 Filed at 10/18/2023 0401    This score is the patient's risk of an unplanned readmission within 30 days of being discharged (0 -100%). The score is based on dignosis, age, lab data, medications, orders, and past utilization.   Low:  0-14.9   Medium: 15-21.9   High: 22-29.9   Extreme: 30 and above           Subjective: Feels much better.  Wants to stay to get stronger, as he would refuse SNF and likely also [facility] therapy.  Breathing better.   Objective: Vitals:   10/18/23 0406 10/18/23 0725  BP: 133/63 (!) 150/73  Pulse: 82 84  Resp:  16  Temp:  97.9 F (36.6 C)  SpO2: 97% 100%    Intake/Output Summary (Last 24 hours) at 10/18/2023 1453 Last data filed at 10/18/2023 1300 Gross per 24 hour  Intake 720 ml  Output 600 ml  Net 120 ml   Filed Weights   10/17/23 1531 10/17/23 2242  Weight: 45.4 kg 52.6 kg    Exam:  General:  Appears calm and comfortable and is in NAD, sitting up in bedside chair eating breakfast, on RA Eyes:  normal lids, iris ENT:  grossly normal hearing, lips & tongue, mmm Cardiovascular:  RRR. No LE edema.  Respiratory:   CTA bilaterally with no wheezes/rales/rhonchi.  Normal respiratory effort. Abdomen:  soft, NT, ND Skin:  no rash or induration seen on limited exam Musculoskeletal:  grossly normal tone BUE/BLE, good ROM, no bony abnormality Psychiatric:  grossly normal  mood and affect, speech fluent and appropriate, AOx3 Neurologic:  CN 2-12 grossly intact, moves all extremities in coordinated fashion  Data Reviewed: I have reviewed the patient's lab results since admission.  Pertinent labs for today include:   Unremarkable BMP Albumin 2.4 Procalcitonin <0.10 WBC 9.3, down from 13.1 Hgb 9.6 Sputum culture GPC Blood cultures NTD    Family Communication: None present  Disposition: Status is: Inpatient Remains  inpatient appropriate because: ongoing monitoring     Time spent: 50 minutes  Unresulted Labs (From admission, onward)    None        Author: Delon Herald, MD 10/18/2023 2:53 PM  For on call review [email or web address].

## 2023-10-18 NOTE — Hospital Course (Signed)
 89yo with h/o CAD s/p angioplasty, afib, HTN, HLD, DM, and chronic back pain who presented on 7/20 with chest pain.  He was noted to be in afib with RVR to 130 and glucose was 358.  CXR with [facility] PNA. He was started on IVF and Ceftriaxone /Azithromycin .

## 2023-10-19 DIAGNOSIS — J189 Pneumonia, unspecified organism: Secondary | ICD-10-CM | POA: Diagnosis not present

## 2023-10-19 DIAGNOSIS — E43 Unspecified severe protein-calorie malnutrition: Secondary | ICD-10-CM | POA: Insufficient documentation

## 2023-10-19 LAB — GLUCOSE, CAPILLARY
Glucose-Capillary: 94 mg/dL (ref 70–99)
Glucose-Capillary: 148 mg/dL — ABNORMAL HIGH (ref 70–99)
Glucose-Capillary: 119 mg/dL — ABNORMAL HIGH (ref 70–99)

## 2023-10-19 LAB — CBC WITH DIFFERENTIAL/PLATELET
Abs Immature Granulocytes: 0.11 K/uL — ABNORMAL HIGH (ref 0.00–0.07)
Basophils Absolute: 0 K/uL (ref 0.0–0.1)
Basophils Relative: 0 %
Eosinophils Absolute: 0.1 K/uL (ref 0.0–0.5)
Eosinophils Relative: 1 %
HCT: 32.3 % — ABNORMAL LOW (ref 39.0–52.0)
Hemoglobin: 10.4 g/dL — ABNORMAL LOW (ref 13.0–17.0)
Immature Granulocytes: 1 %
Lymphocytes Relative: 10 %
Lymphs Abs: 1 K/uL (ref 0.7–4.0)
MCH: 31.3 pg (ref 26.0–34.0)
MCHC: 32.2 g/dL (ref 30.0–36.0)
MCV: 97.3 fL (ref 80.0–100.0)
Monocytes Absolute: 0.7 K/uL (ref 0.1–1.0)
Monocytes Relative: 7 %
Neutro Abs: 7.7 K/uL (ref 1.7–7.7)
Neutrophils Relative %: 81 %
Platelets: 208 K/uL (ref 150–400)
RBC: 3.32 MIL/uL — ABNORMAL LOW (ref 4.22–5.81)
RDW: 14.4 % (ref 11.5–15.5)
WBC: 9.6 K/uL (ref 4.0–10.5)
nRBC: 0 % (ref 0.0–0.2)

## 2023-10-19 LAB — BASIC METABOLIC PANEL WITH GFR
Anion gap: 12 (ref 5–15)
BUN: 13 mg/dL (ref 8–23)
CO2: 23 mmol/L (ref 22–32)
Calcium: 8.7 mg/dL — ABNORMAL LOW (ref 8.9–10.3)
Chloride: 105 mmol/L (ref 98–111)
Creatinine, Ser: 0.58 mg/dL — ABNORMAL LOW (ref 0.61–1.24)
GFR, Estimated: >60 mL/min (ref >60)
Glucose, Bld: 106 mg/dL — ABNORMAL HIGH (ref 70–99)
Potassium: 3.9 mmol/L (ref 3.5–5.1)
Sodium: 140 mmol/L (ref 135–145)

## 2023-10-19 LAB — MAGNESIUM: Magnesium: 1.9 mg/dL (ref 1.7–2.4)

## 2023-10-19 LAB — PHOSPHORUS: Phosphorus: 2.8 mg/dL (ref 2.5–4.6)

## 2023-10-19 MED ORDER — VITAMIN C 500 MG PO TABS
250.0000 mg | ORAL_TABLET | Freq: Two times a day (BID) | ORAL | Status: DC
  Administered 2023-10-19 – 2023-10-21 (×4): 250 mg via ORAL
  Filled 2023-10-19 (×4): qty 1

## 2023-10-19 MED ORDER — ENOXAPARIN SODIUM 40 MG/0.4ML IJ SOSY
40.0000 mg | PREFILLED_SYRINGE | INTRAMUSCULAR | Status: DC
  Administered 2023-10-19 – 2023-10-20 (×2): 40 mg via SUBCUTANEOUS
  Filled 2023-10-19 (×2): qty 0.4

## 2023-10-19 MED ORDER — AZITHROMYCIN 250 MG PO TABS
500.0000 mg | ORAL_TABLET | Freq: Every day | ORAL | Status: AC
  Administered 2023-10-19 – 2023-10-21 (×3): 500 mg via ORAL
  Filled 2023-10-19 (×3): qty 2

## 2023-10-19 MED ORDER — ENSURE PLUS HIGH PROTEIN PO LIQD
237.0000 mL | Freq: Three times a day (TID) | ORAL | Status: DC
  Administered 2023-10-19 – 2023-10-21 (×5): 237 mL via ORAL

## 2023-10-19 NOTE — Progress Notes (Signed)
 Progress Note   Patient: Rodney Navarro FMW:[id number] DOB: Aug 28, 1924 DOA: 10/17/2023     2 DOS: the patient was seen and examined on 10/19/2023   Brief hospital course: 89yo with h/o CAD s/p angioplasty, afib, HTN, HLD, DM, and chronic back pain who presented on 7/20 with chest pain.  He was noted to be in afib with RVR to 130 and glucose was 358.  CXR with LLL PNA. He was started on IVF and Ceftriaxone /Azithromycin .  Assessment and Plan:  CAP (community acquired pneumonia) Presented with chest pain and productive cough, found to be in aifb with LLL PNA Admitted to telemetry Stable on RA Scheduled and as needed bronchodilators Continue ceftriaxone /azithromycin  Negative blood cultures to date  Generalized weakness He did great with therapy on 7/21 and was able to ambulate very well He declined HH therapy and was likely to dc home However, he is significantly weaker on 7/22 Perhaps he over-exerted himself on 7/21 but currently is struggling to move from bed to chair (markedly different) Will continue to monitor If not improving soon, he may actually need SNF rehab   Paroxysmal atrial fibrillation with RVR  Resume Toprol  XL for rate control Takes ASA at home ?not on [facility] due to fall risk (note from 2017 says no Eliquis due to patient referral because of cost) Echo with preserved EF, overall unremarkable Elevated BNP is likely related to this issue   Type 2 diabetes mellitus with hyperglycemia A1c 6.2, good control  Diet controlled at home Significantly elevated on arrival, ?stress response Will keep carb modified diet but stop checking glucose or covering with SSI   Underweight (BMI < 18.5) Body mass index is 17.46 kg/m.  Has taken trazodone  for insomnia Added mirtazapine  7.5 mg p.o. nightly on admission Consult to nutritional services   Severe protein-calorie malnutrition In the setting of anemia and acute illness May benefit from protein supplementation. Consult to  nutritional services Nutrition Problem: Severe Malnutrition Etiology: social / environmental circumstances Signs/Symptoms: severe fat depletion, severe muscle depletion     Essential hypertension Continue Toprol  XL   Hyperlipidemia Currently not on therapy Follow-up with primary care provider   Normocytic anemia Monitor  Asymptomatic, no c/o bleeding         Consultants: PT OT   Procedures: Echocardiogram 7/21   Antibiotics: Azithromycin  7/20-25 Ceftriaxone  7/20-25  30 Day Unplanned Readmission Risk Score    Flowsheet Row ED to Hosp-Admission (Current) from 10/17/2023 in [facility]  30 Day Unplanned Readmission Risk Score (%) 15.38 Filed at 10/19/2023 1200    This score is the patient's risk of an unplanned readmission within 30 days of being discharged (0 -100%). The score is based on dignosis, age, lab data, medications, orders, and past utilization.   Low:  0-14.9   Medium: 15-21.9   High: 22-29.9   Extreme: 30 and above           Subjective: Very weak and fatigued today.  Markedly different from the day prior.  No obvious new issues otherwise.   Objective: Vitals:   10/19/23 0725 10/19/23 1524  BP: (!) 168/72 (!) 144/88  Pulse: 74 76  Resp: (!) 22 20  Temp: 97.6 F (36.4 C) 98.3 F (36.8 C)  SpO2: 98% 99%    Intake/Output Summary (Last 24 hours) at 10/19/2023 1601 Last data filed at 10/19/2023 0328 Gross per 24 hour  Intake 590 ml  Output 800 ml  Net -210 ml   Filed Weights   10/17/23  1531 10/17/23 2242 10/19/23 0500  Weight: 45.4 kg 52.6 kg 52.1 kg    Exam:  General:  Appears calm and comfortable and is in NAD, sitting up in bedside chair eating breakfast, on RA; appears much more fatigued Eyes:  normal lids, iris ENT:  grossly normal hearing, lips & tongue, mmm Cardiovascular:  RRR. No LE edema.  Respiratory:   CTA bilaterally with no wheezes/rales/rhonchi.  Normal respiratory effort. Abdomen:   soft, NT, ND Skin:  no rash or induration seen on limited exam Musculoskeletal:  grossly normal tone BUE/BLE, good ROM, no bony abnormality Psychiatric:  grossly normal mood and affect, speech fluent and appropriate, AOx3 Neurologic:  CN 2-12 grossly intact, moves all extremities in coordinated fashion  Data Reviewed: I have reviewed the patient's lab results since admission.  Pertinent labs for today include:   Stable BMP WBC 9.6 Hgb 10.4, stable     Family Communication: Daughter and son-in-law were present throughout  Disposition: Status is: Inpatient Remains inpatient appropriate because: ongoing monitoring     Time spent: 50 minutes  Unresulted Labs (From admission, onward)    None        Author: Delon Herald, MD 10/19/2023 4:01 PM  For on call review [email or web address].

## 2023-10-19 NOTE — Plan of Care (Signed)
  Problem: Fluid Volume: Goal: Ability to maintain a balanced intake and output will improve Outcome: Progressing   Problem: Health Behavior/Discharge Planning: Goal: Ability to identify and utilize available resources and services will improve Outcome: Progressing   Problem: Metabolic: Goal: Ability to maintain appropriate glucose levels will improve Outcome: Progressing   Problem: Nutritional: Goal: Maintenance of adequate nutrition will improve Outcome: Progressing   Problem: Clinical Measurements: Goal: Will remain free from infection Outcome: Progressing Goal: Respiratory complications will improve Outcome: Progressing   Problem: Elimination: Goal: Will not experience complications related to urinary retention Outcome: Progressing   Problem: Pain Managment: Goal: General experience of comfort will improve and/or be controlled Outcome: Progressing   Problem: Education: Goal: Ability to describe self-care measures that may prevent or decrease complications (Diabetes Survival Skills Education) will improve Outcome: Not Progressing   Problem: Coping: Goal: Ability to adjust to condition or change in health will improve Outcome: Not Progressing   Problem: Health Behavior/Discharge Planning: Goal: Ability to manage health-related needs will improve Outcome: Not Progressing   Problem: Skin Integrity: Goal: Risk for impaired skin integrity will decrease Outcome: Not Progressing   Problem: Coping: Goal: Level of anxiety will decrease Outcome: Not Progressing

## 2023-10-19 NOTE — Progress Notes (Signed)
 Physical Therapy Treatment Patient Details Name: Rodney Navarro MRN: [id number] DOB: 1924/08/13 Today's Date: 10/19/2023   History of Present Illness Pt is a 89 y.o. male who presented to the ED with complaints of chest pain, dyspnea and increase in productive cough, a-fib with RVR. Admitted for management of CAP, A-fib. PMH of CAD, history of cardiac cath and angioplasty, paroxysmal atrial fibrillation, hypertension, hyperlipidemia, type 2 diabetes, esophagitis, lumbar DDD, lumbar radiculitis, history of nonmelanoma skin cancer.    PT Comments  Pt sleepy this am but does wake to voice.  Family in room stating they have tried to wake him and keep him awake for the past 30 minutes but he keeps falling asleep.  He does wake and requires min a x 1 to EOB today.  Supervision with sitting.  He stands with min a x 1 and is able to take several small steps to recliner at bedside.  Fatigued with effort.  Pt does not feel he can walk this AM and feels weak. Stated he slept poorly but does not voice any specific complaints.    Discussed with family in room.  Voice concerns over mobility today and that he lives alone with no supports and refuses outside help.  Discussed with MD and nurse and relayed concerns.  Pt did very well at eval yesterday with HHPT recommended.  May need to change to <3hrs a day therapy if he does not perk up which family is in support of but pt may resist.   If plan is discharge home, recommend the following: Assistance with cooking/housework;Assist for transportation;Help with stairs or ramp for entrance;A little help with walking and/or transfers;A little help with bathing/dressing/bathroom   Can travel by private vehicle        Equipment Recommendations  None recommended by PT    Recommendations for Other Services       Precautions / Restrictions Precautions Precautions: Fall Recall of Precautions/Restrictions: Intact Restrictions Weight Bearing Restrictions Per Provider  Order: No     Mobility  Bed Mobility Overal bed mobility: Needs Assistance Bed Mobility: Supine to Sit     Supine to sit: Min assist     General bed mobility comments: increased time/effort Patient Response: Cooperative  Transfers Overall transfer level: Needs assistance Equipment used: Rolling walker (2 wheels) Transfers: Sit to/from Stand Sit to Stand: Min assist                Ambulation/Gait Ambulation/Gait assistance: Min Chemical engineer (Feet): 3 Feet Assistive device: Rolling walker (2 wheels) Gait Pattern/deviations: Step-to pattern Gait velocity: dec     General Gait Details: struggled with some steps to recliner at bedside.   Stairs             Wheelchair Mobility     Tilt Bed Tilt Bed Patient Response: Cooperative  Modified Rankin (Stroke Patients Only)       Balance Overall balance assessment: Needs assistance Sitting-balance support: Feet supported Sitting balance-Leahy Scale: Fair     Standing balance support: Bilateral upper extremity supported Standing balance-Leahy Scale: Fair                              Hotel manager: Impaired Factors Affecting Communication: Hearing impaired  Cognition Arousal: Lethargic Behavior During Therapy: WFL for tasks assessed/performed   PT - Cognitive impairments: No apparent impairments  Following commands: Intact      Cueing Cueing Techniques: Verbal cues  Exercises      General Comments        Pertinent Vitals/Pain Pain Assessment Pain Assessment: No/denies pain    Home Living                          Prior Function            PT Goals (current goals can now be found in the care plan section) Progress towards PT goals: Progressing toward goals    Frequency    Min 2X/week      PT Plan      Co-evaluation              AM-PAC PT 6 Clicks Mobility   Outcome  Measure  Help needed turning from your back to your side while in a flat bed without using bedrails?: A Little Help needed moving from lying on your back to sitting on the side of a flat bed without using bedrails?: A Little Help needed moving to and from a bed to a chair (including a wheelchair)?: A Little Help needed standing up from a chair using your arms (e.g., wheelchair or bedside chair)?: A Little Help needed to walk in hospital room?: A Little Help needed climbing 3-5 steps with a railing? : A Little 6 Click Score: 18    End of Session Equipment Utilized During Treatment: Gait belt Activity Tolerance: Patient limited by fatigue Patient left: in chair;with call bell/phone within reach;with chair alarm set;with family/visitor present Nurse Communication: Mobility status PT Visit Diagnosis: Other abnormalities of gait and mobility (R26.89);Difficulty in walking, not elsewhere classified (R26.2);Muscle weakness (generalized) (M62.81)     Time: 9040-8986 PT Time Calculation (min) (ACUTE ONLY): 14 min  Charges:    $Therapeutic Activity: 8-22 mins PT General Charges $$ ACUTE PT VISIT: 1 Visit                    Lauraine Gills, PTA 10/19/23, 10:47 AM

## 2023-10-19 NOTE — Progress Notes (Signed)
 Initial Nutrition Assessment  DOCUMENTATION CODES:   Severe malnutrition in context of social or environmental circumstances  INTERVENTION:   Ensure Plus High Protein po TID, each supplement provides 350 kcal and 20 grams of protein  Magic cup TID with meals, each supplement provides 290 kcal and 9 grams of protein  MVI po daily   Vitamin C  250mg  po BID   Liberalize diet   Pt at high refeed risk; recommend monitor potassium, magnesium  and phosphorus labs daily until stable  Daily weights   NUTRITION DIAGNOSIS:   Severe Malnutrition related to social / environmental circumstances as evidenced by severe fat depletion, severe muscle depletion.  GOAL:   Patient will meet greater than or equal to 90% of their needs  MONITOR:   PO intake, Supplement acceptance, Labs, Weight trends, Skin, I & O's  REASON FOR ASSESSMENT:   Consult Assessment of nutrition requirement/status  ASSESSMENT:   89 y/o male with h/o CAD s/p cardiac cath and angioplasty, HTN, HLD, DM, PAF and  lumbar radiculopathy who is admitted with CAP.  Met with pt in room today. Pt is very pleasant. Pt reports that he is not feeling well. Pt's main complaint is weakness and no energy; pt is upset as he reports that he was able to walk around with the RN yesterday and today he is too weak. Pt reports that his appetite and oral intake is poor at baseline. Pt reports weight loss and poor oral intake since his wife passed away in 2018-11-11. Pt reports that he has lost down from 155lbs. Per chart, pt appears to be down 40lbs(26%) over the past several years but does appear weight stable for at least the past 8 months. Pt reports that at home, he has friends at [employer] and [employer] whom let him eat for free during the week. Pt also reports that he gets dinner brought in every night from what sounds like a food kitchen that he lives across the street from. Pt also has a son that brings him food and Ensure. Pt reports that  he has been drinking Ensure for years (coupons provided today). Pt also takes a Centrum Silver multivitamin daily. Pt denies any issues with chewing or swallowing but does report that large pills will get stuck in his throat sometimes.   Pt reports eating a few bites of pudding today but reports that he did drink two Ensure supplements. Pt reports that his appetite was great while he was on prednisone; pt previously documented to be eating 100% of meals. Pt has been started on remeron  here for appetite stimulant. RD will add Magic Cups to meal trays. RD will also liberalize pt's diet. Continue Ensure and MVI. Pt is at refeed risk.   Medications reviewed and include: azithromycin , lovenox , melatonin, remeron , MVI, protonix , ceftriaxone    Labs reviewed: K 3.9 wnl, creat 0.58(L), P 2.8 wnl, Mg 1.9 wnl Hgb 10.4(L), Hct 32.3(L) Cbgs- 148, 94 x 24 hrs  AIC 6.2(H)- 7/20  NUTRITION - FOCUSED PHYSICAL EXAM:  Flowsheet Row Most Recent Value  Orbital Region Severe depletion  Upper Arm Region Severe depletion  Thoracic and Lumbar Region Severe depletion  Buccal Region Severe depletion  Temple Region Severe depletion  Clavicle Bone Region Severe depletion  Clavicle and Acromion Bone Region Severe depletion  Scapular Bone Region Severe depletion  Dorsal Hand Severe depletion  Patellar Region Severe depletion  Anterior Thigh Region Severe depletion  Posterior Calf Region Severe depletion  Edema (RD Assessment) None  Hair Reviewed  Eyes Reviewed  Mouth Reviewed  Skin Reviewed  Nails Reviewed   Diet Order:   Diet Order             Diet regular Room service appropriate? Yes; Fluid consistency: Thin  Diet effective now                  EDUCATION NEEDS:   Education needs have been addressed  Skin:  Skin Assessment: Reviewed RN Assessment (L arm wounds)  Last BM:  7/20  Height:   Ht Readings from Last 1 Encounters:  10/17/23 5' 8 (1.727 m)    Weight:   Wt Readings from Last 1  Encounters:  10/19/23 52.1 kg    Ideal Body Weight:  70 kg  BMI:  Body mass index is 17.46 kg/m.  Estimated Nutritional Needs:   Kcal:  1500-1700kcal/day  Protein:  70-80g/day  Fluid:  1.4-1.6L/day  Augustin Shams MS, RD, LDN If unable to be reached, please send secure chat to RD inpatient available from 8:00a-4:00p daily

## 2023-10-19 NOTE — TOC Initial Note (Signed)
 Transition of Care [facility]) - Initial/Assessment Note    Patient Details  Name: Rodney Navarro MRN: [id number] Date of Birth: 11-20-1924  Transition of [facility]) CM/SW Contact:    Corean ONEIDA Haddock, RN Phone Number: 10/19/2023, 3:48 PM  Clinical Narrative:                      Went to patient's room to discuss Home health recs.  Per MD patient intends to decline Patient was sleeping soundly when I entered the room.  Daughter Candis and in Social worker at bedside.  Candis confirms that patient will decline HH, and no DME needs.   If therapy recs were to change to SNF, Candis states that he may be willing to go.  TOC continue to follow for progression    Patient Goals and CMS Choice            Expected Discharge Plan and Services                                              Prior Living Arrangements/Services                       Activities of Daily Living   ADL Screening (condition at time of admission) Independently performs ADLs?: Yes (appropriate for developmental age) Is the patient deaf or have difficulty hearing?: Yes Does the patient have difficulty seeing, even when wearing glasses/contacts?: Yes Does the patient have difficulty concentrating, remembering, or making decisions?: No  Permission Sought/Granted                  Emotional Assessment              Admission diagnosis:  CAP (community acquired pneumonia) [J18.9] Atrial fibrillation with RVR (HCC) [I48.91] Sepsis due to pneumonia (HCC) [J18.9, A41.9] Patient Active Problem List   Diagnosis Date Noted   Protein-calorie malnutrition, severe 10/19/2023   CAP (community acquired pneumonia) 10/17/2023   Underweight (BMI < 18.5) 10/17/2023   Type 2 diabetes mellitus with hyperglycemia (HCC) 10/17/2023   Normocytic anemia 10/17/2023   Moderate protein malnutrition (HCC) 10/17/2023   Elevated brain natriuretic peptide (BNP) level 10/17/2023   Paroxysmal atrial fibrillation with RVR (HCC)  10/17/2023   Lumbar radiculitis 07/14/2021   History of nonmelanoma skin cancer 07/13/2020   Paroxysmal atrial fibrillation (HCC) 10/19/2017   Swelling of lower extremity 08/25/2017   Essential hypertension 08/25/2017   Syncope 10/18/2015   SCC (squamous cell carcinoma), scalp/neck 09/18/2014   Diabetes mellitus (HCC) 08/27/2013   Disorder of intervertebral disc 08/27/2013   Esophagitis 08/27/2013   Hyperlipidemia 08/27/2013   Osteoporosis 08/27/2013   S/P cardiac catheterization 07/08/2013   Recurrent coronary arteriosclerosis after percutaneous transluminal coronary angioplasty 07/08/2013   PCP:  [name], MD Pharmacy:   [facility] - [place], [place] - [address] [place] [address] Phone: [phone] Fax: [phone]     Social Drivers of Health (SDOH) Social History: SDOH Screenings   Food Insecurity: No Food Insecurity (10/17/2023)  Housing: Low Risk  (10/17/2023)  Transportation Needs: No Transportation Needs (10/17/2023)  Utilities: Not At Risk (10/17/2023)  Financial Resource Strain: Low Risk  (12/30/2022)   Received from [facility]  Social Connections: Unknown (10/18/2023)  Tobacco Use: Medium Risk (10/18/2023)   SDOH Interventions:  Readmission Risk Interventions     No data to display

## 2023-10-19 NOTE — Progress Notes (Signed)
 PHARMACIST - PHYSICIAN COMMUNICATION DR:   TRH CONCERNING: Antibiotic IV to Oral Route Change Policy  RECOMMENDATION: This patient is receiving azithromycin  by the intravenous route.  Based on criteria approved by the Pharmacy and Therapeutics Committee, the antibiotic(s) is/are being converted to the equivalent oral dose form(s).   DESCRIPTION: These criteria include: Patient being treated for a respiratory tract infection, urinary tract infection, cellulitis or clostridium difficile associated diarrhea if on metronidazole The patient is not neutropenic and does not exhibit a GI malabsorption state The patient is eating (either orally or via tube) and/or has been taking other orally administered medications for a least 24 hours The patient is improving clinically and has a Tmax < 100.5  If you have questions about this conversion, please contact the Pharmacy Department  []   [phone] )  [facility] [x]   [phone] )  [facility] []   [phone] )  [facility] []   [phone] )  [facility] []   [phone] )  [facility]   Celestine Slovak, PharmD, [place], [place] Work Cell: [phone] 10/19/2023 10:37 AM

## 2023-10-19 NOTE — Progress Notes (Signed)
   10/19/23 1230  Spiritual Encounters  Type of Visit Initial  Care provided to: Pt and family  Referral source Nurse (RN/NT/LPN)  Reason for visit Advance directives  OnCall Visit Yes   Chaplain visited patient per [facility] in the EPIC system for an AD.  Chaplain checked with patient and family and family shared (and showed) that they'd already had one that had been completed.  Family shared that staff had already scanned document in and provided a copy.  Chaplain asked if the family had any other spiritual care needs at this time and they said they did not.    Rev. Rana M. Nicholaus, M.Div. Chaplain Resident  [facility]

## 2023-10-19 NOTE — Progress Notes (Signed)
 Occupational Therapy Treatment Patient Details Name: Rodney Navarro MRN: [id number] DOB: 1924/08/28 Today's Date: 10/19/2023   History of present illness Pt is a 89 y.o. male who presented to the ED with complaints of chest pain, dyspnea and increase in productive cough, a-fib with RVR. Admitted for management of CAP, A-fib. PMH of CAD, history of cardiac cath and angioplasty, paroxysmal atrial fibrillation, hypertension, hyperlipidemia, type 2 diabetes, esophagitis, lumbar DDD, lumbar radiculitis, history of nonmelanoma skin cancer.   OT comments  Rodney Navarro seen for OT treatment on this date. Upon arrival to room pt in bed, reporting increased weakness and fatigue from yesterday, but motivated to get OOB. Pt requires MIN A for bed mobility and STS at EOB; CGA + RW for step pivot from bed>chair.   Will continue to follow POC and d/c rec remains appropriate, however, pt may benefit from <3 hrs/day of STR if strength and mobility does not improve.         If plan is discharge home, recommend the following:  A little help with walking and/or transfers;A little help with bathing/dressing/bathroom;Help with stairs or ramp for entrance   Equipment Recommendations  None recommended by OT    Recommendations for Other Services      Precautions / Restrictions Precautions Precautions: Fall Recall of Precautions/Restrictions: Intact Restrictions Weight Bearing Restrictions Per Provider Order: No       Mobility Bed Mobility Overal bed mobility: Needs Assistance Bed Mobility: Supine to Sit     Supine to sit: Min assist          Transfers Overall transfer level: Needs assistance Equipment used: Rolling walker (2 wheels) Transfers: Sit to/from Stand, Bed to chair/wheelchair/BSC Sit to Stand: Min assist     Step pivot transfers: Contact guard assist           Balance Overall balance assessment: Needs assistance Sitting-balance support: Feet supported Sitting balance-Leahy Scale:  Fair     Standing balance support: Bilateral upper extremity supported, Reliant on assistive device for balance Standing balance-Leahy Scale: Fair                             ADL either performed or assessed with clinical judgement   ADL                                              Extremity/Trunk Assessment Upper Extremity Assessment Upper Extremity Assessment: Generalized weakness   Lower Extremity Assessment Lower Extremity Assessment: Generalized weakness        Vision       Perception     Praxis     Communication Communication Communication: Impaired Factors Affecting Communication: Hearing impaired   Cognition Arousal: Alert Behavior During Therapy: WFL for tasks assessed/performed Cognition: No apparent impairments                               Following commands: Intact        Cueing   Cueing Techniques: Verbal cues, Gestural cues  Exercises      Shoulder Instructions       General Comments      Pertinent Vitals/ Pain       Pain Assessment Pain Assessment: No/denies pain  Home Living  Prior Functioning/Environment              Frequency  Min 2X/week        Progress Toward Goals  OT Goals(current goals can now be found in the care plan section)  Progress towards OT goals: Progressing toward goals  Acute Rehab OT Goals Patient Stated Goal: to go home OT Goal Formulation: With patient Time For Goal Achievement: 11/02/23 Potential to Achieve Goals: Good ADL Goals Pt Will Perform Lower Body Bathing: sit to/from stand;sitting/lateral leans;with modified independence Pt Will Perform Lower Body Dressing: with modified independence;sit to/from stand;sitting/lateral leans Pt Will Transfer to Toilet: with supervision;with modified independence;regular height toilet;ambulating  Plan      Co-evaluation                  AM-PAC OT 6 Clicks Daily Activity     Outcome Measure   Help from another person eating meals?: None Help from another person taking care of personal grooming?: None Help from another person toileting, which includes using toliet, bedpan, or urinal?: A Lot Help from another person bathing (including washing, rinsing, drying)?: A Lot Help from another person to put on and taking off regular upper body clothing?: A Little Help from another person to put on and taking off regular lower body clothing?: A Little 6 Click Score: 18    End of Session Equipment Utilized During Treatment: Gait belt;Rolling walker (2 wheels)  OT Visit Diagnosis: Other abnormalities of gait and mobility (R26.89);Unsteadiness on feet (R26.81);Muscle weakness (generalized) (M62.81);Repeated falls (R29.6)   Activity Tolerance Patient tolerated treatment well;Patient limited by fatigue   Patient Left in chair;with call bell/phone within reach;with chair alarm set   Nurse Communication          Time: 1520-1540 OT Time Calculation (min): 20 min  Charges: OT General Charges $OT Visit: 1 Visit OT Treatments $Self Care/Home Management : 8-22 mins  Rodney Navarro, Student OT   Rodney Navarro 10/19/2023, 4:22 PM

## 2023-10-19 NOTE — Care Management Important Message (Signed)
 Important Message  Patient Details  Name: NASRI BOAKYE MRN: [id number] Date of Birth: 22-Jul-1924   Important Message Given:  Yes - Medicare IM     Rojelio SHAUNNA Rattler 10/19/2023, 1:21 PM

## 2023-10-20 DIAGNOSIS — J189 Pneumonia, unspecified organism: Secondary | ICD-10-CM | POA: Diagnosis not present

## 2023-10-20 LAB — CULTURE, RESPIRATORY W GRAM STAIN: Culture: NORMAL

## 2023-10-20 MED ORDER — TRAZODONE HCL 50 MG PO TABS: 50.0000 mg | ORAL_TABLET | Freq: Every evening | ORAL | Status: DC | PRN

## 2023-10-20 MED ORDER — VITAMIN B-12 1000 MCG PO TABS
1000.0000 ug | ORAL_TABLET | Freq: Every day | ORAL | Status: DC
  Administered 2023-10-21: 1000 ug via ORAL
  Filled 2023-10-20: qty 1

## 2023-10-20 MED ORDER — PROSIGHT PO TABS
1.0000 | ORAL_TABLET | Freq: Two times a day (BID) | ORAL | Status: DC
  Administered 2023-10-20 – 2023-10-21 (×2): 1 via ORAL
  Filled 2023-10-20 (×2): qty 1

## 2023-10-20 MED ORDER — IPRATROPIUM-ALBUTEROL 0.5-2.5 (3) MG/3ML IN SOLN: 3.0000 mL | RESPIRATORY_TRACT | Status: DC | PRN

## 2023-10-20 NOTE — Plan of Care (Signed)

## 2023-10-20 NOTE — NC FL2 (Signed)
 [place]  MEDICAID FL2 LEVEL OF CARE FORM     IDENTIFICATION  Patient Name: Rodney Navarro Birthdate: [birth year] Sex: male Admission Date (Current Location): 10/17/2023  [facility] and [place] Number:  Chiropodist and Address:  [facility], [address] [place], [place] [address]      Provider Number: [id number]  Attending Physician Name and Address:  Jhonny Calvin NOVAK, MD  Relative Name and Phone Number:       Current Level of Care: Hospital Recommended Level of Care: Skilled Nursing Facility Prior Approval Number:    Date Approved/Denied:   PASRR Number: [id number]  Discharge Plan: SNF    Current Diagnoses: Patient Active Problem List   Diagnosis Date Noted   Protein-calorie malnutrition, severe 10/19/2023   CAP (community acquired pneumonia) 10/17/2023   Underweight (BMI < 18.5) 10/17/2023   Type 2 diabetes mellitus with hyperglycemia (HCC) 10/17/2023   Normocytic anemia 10/17/2023   Moderate protein malnutrition (HCC) 10/17/2023   Elevated brain natriuretic peptide (BNP) level 10/17/2023   Paroxysmal atrial fibrillation with RVR (HCC) 10/17/2023   Lumbar radiculitis 07/14/2021   History of nonmelanoma skin cancer 07/13/2020   Paroxysmal atrial fibrillation (HCC) 10/19/2017   Swelling of lower extremity 08/25/2017   Essential hypertension 08/25/2017   Syncope 10/18/2015   SCC (squamous cell carcinoma), scalp/neck 09/18/2014   Diabetes mellitus (HCC) 08/27/2013   Disorder of intervertebral disc 08/27/2013   Esophagitis 08/27/2013   Hyperlipidemia 08/27/2013   Osteoporosis 08/27/2013   S/P cardiac catheterization 07/08/2013   Recurrent coronary arteriosclerosis after percutaneous transluminal coronary angioplasty 07/08/2013    Orientation RESPIRATION BLADDER Height & Weight     Self, Time, Situation, Place  Normal Incontinent Weight: 45.9 kg Height:  5' 8 (172.7 cm)  BEHAVIORAL SYMPTOMS/MOOD NEUROLOGICAL BOWEL  NUTRITION STATUS      Continent Diet  AMBULATORY STATUS COMMUNICATION OF NEEDS Skin   Limited Assist Verbally Skin abrasions, Bruising                       Personal Care Assistance Level of Assistance              Functional Limitations Info  Hearing, Sight Sight Info: Impaired Hearing Info: Impaired      SPECIAL CARE FACTORS FREQUENCY  PT (By licensed PT), OT (By licensed OT)                    Contractures Contractures Info: Not present    Additional Factors Info  Code Status, Allergies Code Status Info: DNR Allergies Info: Alendronate, Risedronate, Tetanus Toxoids           Current Medications (10/20/2023):  This is the current hospital active medication list Current Facility-Administered Medications  Medication Dose Route Frequency Provider Last Rate Last Admin   acetaminophen  (TYLENOL ) tablet 325 mg  325 mg Oral Q6H PRN Celinda Alm Lot, MD   325 mg at 10/19/23 2017   Or   acetaminophen  (TYLENOL ) suppository 325 mg  325 mg Rectal Q6H PRN Celinda Alm Lot, MD       artificial tears ophthalmic solution 1 drop  1 drop Both Eyes QID PRN Dail Rankin RAMAN, RPH   1 drop at 10/19/23 1410   ascorbic acid  (VITAMIN C ) tablet 250 mg  250 mg Oral BID Barbarann Nest, MD   250 mg at 10/20/23 0900   aspirin  EC tablet 81 mg  81 mg Oral Daily Celinda Alm Lot, MD   81 mg at  10/20/23 0900   azithromycin  (ZITHROMAX ) tablet 500 mg  500 mg Oral Daily Zeigler, Dustin G, RPH   500 mg at 10/20/23 0900   cefTRIAXone  (ROCEPHIN ) 1 g in sodium chloride  0.9 % 100 mL IVPB  1 g Intravenous Q24H Barbarann Nest, MD 200 mL/hr at 10/19/23 1417 1 g at 10/19/23 1417   enoxaparin  (LOVENOX ) injection 40 mg  40 mg Subcutaneous Q24H Greenwood, Howard F, RPH   40 mg at 10/19/23 2018   feeding supplement (ENSURE PLUS HIGH PROTEIN) liquid 237 mL  237 mL Oral TID BM Barbarann Nest, MD   237 mL at 10/20/23 0900   fluticasone  (FLONASE ) 50 MCG/ACT nasal spray 2 spray  2 spray Each Nare  Daily Barbarann Nest, MD   2 spray at 10/20/23 0900   guaiFENesin  (MUCINEX ) 12 hr tablet 600 mg  600 mg Oral BID Celinda Alm Lot, MD   600 mg at 10/20/23 0900   melatonin tablet 5 mg  5 mg Oral QHS Yates, Jennifer, MD   5 mg at 10/19/23 2018   metoprolol  succinate (TOPROL -XL) 24 hr tablet 50 mg  50 mg Oral Daily Barbarann Nest, MD   50 mg at 10/20/23 0900   mirtazapine  (REMERON ) tablet 7.5 mg  7.5 mg Oral QHS Celinda Alm Lot, MD   7.5 mg at 10/19/23 2018   multivitamin with minerals tablet 1 tablet  1 tablet Oral Daily Barbarann Nest, MD   1 tablet at 10/20/23 0900   ondansetron  (ZOFRAN ) tablet 4 mg  4 mg Oral Q6H PRN Celinda Alm Lot, MD       Or   ondansetron  (ZOFRAN ) injection 4 mg  4 mg Intravenous Q6H PRN Celinda Alm Lot, MD       pantoprazole  (PROTONIX ) EC tablet 40 mg  40 mg Oral Daily Celinda Alm Lot, MD   40 mg at 10/20/23 0900   traZODone  (DESYREL ) tablet 50 mg  50 mg Oral QHS Yates, Jennifer, MD   50 mg at 10/19/23 2018     Discharge Medications: Please see discharge summary for a list of discharge medications.  Relevant Imaging Results:  Relevant Lab Results:   Additional Information [id number]  Wang [place] M Lailanie Hasley, RN

## 2023-10-20 NOTE — Progress Notes (Signed)
 PROGRESS NOTE    Rodney Navarro  FMW:[id number] DOB: [birth year] DOA: 10/17/2023 PCP: [name], MD    Brief Narrative:   [id number] with h/o CAD s/p angioplasty, afib, HTN, HLD, DM, and chronic back pain who presented on 7/20 with chest pain.  He was noted to be in afib with RVR to 130 and glucose was 358.  CXR with LLL PNA. He was started on IVF and Ceftriaxone /Azithromycin .   Assessment & Plan:   Principal Problem:   CAP (community acquired pneumonia) Active Problems:   Essential hypertension   Hyperlipidemia   Underweight (BMI < 18.5)   Type 2 diabetes mellitus with hyperglycemia (HCC)   Normocytic anemia   Moderate protein malnutrition (HCC)   Paroxysmal atrial fibrillation with RVR (HCC)   Protein-calorie malnutrition, severe   CAP (community acquired pneumonia) Presented with chest pain and productive cough, found to be in aifb with LLL PNA Admitted to telemetry Stable on RA Plan: Continue 5-day course of Rocephin .  Continue 3-day course of azithromycin .  On room air.  Continue as needed bronchodilators.  Follow cultures, no growth to date.  Otherwise medically stable for discharge.  Pending skilled nursing facility.  Can now transition to oral antibiotics if accepting facility was found before completion of antibiotic course.   Generalized weakness He did great with therapy on 7/21 and was able to ambulate very well He declined HH therapy and was likely to dc home However, he is significantly weaker on 7/22.  Weakness slowly improving but skilled nursing facility recommended by PT on 7/23.  Patient and family agreeable. Plan: SNF workup   Paroxysmal atrial fibrillation with RVR  Resume Toprol  XL for rate control Takes ASA at home ?not on AC due to fall risk (note from 2017 says no Eliquis due to patient referral because of cost) Echo with preserved EF, overall unremarkable Elevated BNP is likely related to this issue Outpatient follow-up cardiology versus PCP    Type 2 diabetes mellitus with hyperglycemia A1c 6.2, good control  Diet controlled at home Okay for regular diet   Underweight (BMI < 18.5) Body mass index is 17.46 kg/m.  Has taken trazodone  for insomnia RD consultation   Severe protein-calorie malnutrition In the setting of anemia and acute illness RD following On supplemental protein shakes   Essential hypertension Continue Toprol  XL   Hyperlipidemia Currently not on therapy Outpatient PCP follow-up   Normocytic anemia Continue to monitor Asymptomatic, no c/o bleeding   DVT prophylaxis: SQ Lovenox  Code Status: DNR Family Communication: None today Disposition Plan: Status is: Inpatient Remains inpatient appropriate because: Unsafe discharge plan.  Need skilled facility.  Otherwise medically stable.   Level of care: Med-Surg  Consultants:  None  Procedures:  None  Antimicrobials: Ceftriaxone  Azithromycin    Subjective: Seen and examined.  Resting comfortably in bed.  No visible distress.  Answers her questions appropriately.  No pain complaints.  Objective: Vitals:   10/19/23 2047 10/20/23 0331 10/20/23 0411 10/20/23 0731  BP: (!) 147/62 (!) 147/70  (!) 167/84  Pulse: 64 61  63  Resp: 20 20  20   Temp: 98.1 F (36.7 C) (!) 97.5 F (36.4 C)  97.8 F (36.6 C)  TempSrc: Oral Oral  Oral  SpO2: 100% 99%  100%  Weight:   45.9 kg   Height:        Intake/Output Summary (Last 24 hours) at 10/20/2023 1229 Last data filed at 10/20/2023 1126 Gross per 24 hour  Intake --  Output 1575 ml  Net -1575 ml   Filed Weights   10/17/23 2242 10/19/23 0500 10/20/23 0411  Weight: 52.6 kg 52.1 kg 45.9 kg    Examination:  General exam: Appears calm and comfortable  Respiratory system: Scattered crackles, worse at bases.  Normal work of breathing.  Room air Cardiovascular system: S1-S2, regular rate, irregular rhythm, no murmurs, no pedal edema Gastrointestinal system: Soft, NT/ND, normal bowel sounds Central  nervous system: Alert and oriented. No focal neurological deficits. Extremities: Symmetric 5 x 5 power. Skin: No rashes, lesions or ulcers Psychiatry: Judgement and insight appear normal. Mood & affect appropriate.     Data Reviewed: I have personally reviewed following labs and imaging studies  CBC: Recent Labs  Lab 10/17/23 1535 10/18/23 0300 10/19/23 0323  WBC 13.1* 9.3 9.6  NEUTROABS 11.3*  --  7.7  HGB 10.1* 9.6* 10.4*  HCT 30.8* 30.0* 32.3*  MCV 96.3 98.0 97.3  PLT 191 183 208   Basic Metabolic Panel: Recent Labs  Lab 10/17/23 1535 10/18/23 0300 10/19/23 0323  NA 138 139 140  K 4.1 4.0 3.9  CL 103 103 105  CO2 24 25 23   GLUCOSE 271* 102* 106*  BUN 17 12 13   CREATININE 0.75 0.63 0.58*  CALCIUM 8.7* 8.6* 8.7*  MG 1.7  --  1.9  PHOS  --   --  2.8   GFR: Estimated Creatinine Clearance: 35.1 mL/min (A) (by C-G formula based on SCr of 0.58 mg/dL (L)). Liver Function Tests: Recent Labs  Lab 10/17/23 1535 10/18/23 0300  AST 27 38  ALT 25 41  ALKPHOS 62 65  BILITOT 0.7 0.7  PROT 5.9* 5.0*  ALBUMIN 2.6* 2.4*   Recent Labs  Lab 10/17/23 1535  LIPASE 24   No results for input(s): AMMONIA in the last 168 hours. Coagulation Profile: No results for input(s): INR, PROTIME in the last 168 hours. Cardiac Enzymes: No results for input(s): CKTOTAL, CKMB, CKMBINDEX, TROPONINI in the last 168 hours. BNP (last 3 results) No results for input(s): PROBNP in the last 8760 hours. HbA1C: Recent Labs    10/17/23 1801  HGBA1C 6.2*   CBG: Recent Labs  Lab 10/18/23 1616 10/18/23 2011 10/19/23 0726 10/19/23 1107 10/19/23 1608  GLUCAP 83 141* 94 148* 119*   Lipid Profile: No results for input(s): CHOL, HDL, LDLCALC, TRIG, CHOLHDL, LDLDIRECT in the last 72 hours. Thyroid Function Tests: No results for input(s): TSH, T4TOTAL, FREET4, T3FREE, THYROIDAB in the last 72 hours. Anemia Panel: No results for input(s):  VITAMINB12, FOLATE, FERRITIN, TIBC, IRON, RETICCTPCT in the last 72 hours. Sepsis Labs: Recent Labs  Lab 10/17/23 1535 10/17/23 1659 10/17/23 1801 10/18/23 0300  PROCALCITON <0.10  --   --  <0.10  LATICACIDVEN  --  1.3 1.2  --     Recent Results (from the past 240 hours)  Blood culture (routine x 2)     Status: None (Preliminary result)   Collection Time: 10/17/23  4:59 PM   Specimen: BLOOD  Result Value Ref Range Status   Specimen Description BLOOD BLOOD LEFT FOREARM  Final   Special Requests   Final    BOTTLES DRAWN AEROBIC AND ANAEROBIC Blood Culture adequate volume   Culture   Final    NO GROWTH 3 DAYS Performed at [facility], [address] [place], [place] [address]    Report Status PENDING  Incomplete  Blood culture (routine x 2)     Status: None (Preliminary result)   Collection Time: 10/17/23  4:59 PM  Specimen: BLOOD  Result Value Ref Range Status   Specimen Description BLOOD BLOOD RIGHT FOREARM  Final   Special Requests   Final    BOTTLES DRAWN AEROBIC AND ANAEROBIC Blood Culture results may not be optimal due to an inadequate volume of blood received in culture bottles   Culture   Final    NO GROWTH 3 DAYS Performed at [facility], [address] [place], [place] [address]    Report Status PENDING  Incomplete  Expectorated Sputum Assessment w Gram Stain, Rflx to Resp Cult     Status: None   Collection Time: 10/17/23  6:28 PM   Specimen: Sputum  Result Value Ref Range Status   Specimen Description SPUTUM  Final   Special Requests  EXPSU  Final   Sputum evaluation   Final    THIS SPECIMEN IS ACCEPTABLE FOR SPUTUM CULTURE Performed at [facility], [address] [place], [place] [address]    Report Status 10/17/2023 FINAL  Final  Culture, Respiratory w Gram Stain     Status: None   Collection Time: 10/17/23  6:28 PM   Specimen: SPU  Result Value Ref Range Status   Specimen Description   Final     SPUTUM Performed at [facility], [address], [place], [place] [address]    Special Requests   Final     EXPSU Reflexed from [id number] Performed at [facility], [address] [place], [place] [address]    Gram Stain   Final    RARE WBC PRESENT, PREDOMINANTLY PMN RARE GRAM POSITIVE COCCI    Culture   Final    Normal respiratory flora-no Staph aureus or Pseudomonas seen Performed at [facility], [address] [place], [place] [address]    Report Status 10/20/2023 FINAL  Final         Radiology Studies: No results found.      Scheduled Meds:  vitamin C   250 mg Oral BID   aspirin  EC  81 mg Oral Daily   azithromycin   500 mg Oral Daily   enoxaparin  (LOVENOX ) injection  40 mg Subcutaneous Q24H   feeding supplement  237 mL Oral TID BM   fluticasone   2 spray Each Nare Daily   guaiFENesin   600 mg Oral BID   melatonin  5 mg Oral QHS   metoprolol  succinate  50 mg Oral Daily   mirtazapine   7.5 mg Oral QHS   multivitamin with minerals  1 tablet Oral Daily   pantoprazole   40 mg Oral Daily   traZODone   50 mg Oral QHS   Continuous Infusions:  cefTRIAXone  (ROCEPHIN )  IV 1 g (10/19/23 1417)     LOS: 3 days       Rodney KATHEE Robson, MD [facility]   If 7PM-7AM, please contact night-coverage  10/20/2023, 12:29 PM

## 2023-10-20 NOTE — Progress Notes (Signed)
 Physical Therapy Treatment Patient Details Name: Rodney Navarro MRN: [id number] DOB: Aug 27, 1924 Today's Date: 10/20/2023   History of Present Illness Pt is a 89 y.o. male who presented to the ED with complaints of chest pain, dyspnea and increase in productive cough, a-fib with RVR. Admitted for management of CAP, A-fib. PMH of CAD, history of cardiac cath and angioplasty, paroxysmal atrial fibrillation, hypertension, hyperlipidemia, type 2 diabetes, esophagitis, lumbar DDD, lumbar radiculitis, history of nonmelanoma skin cancer.    PT Comments  Pt making progress with functional mobility as compared to previous session; however, he remains very limited overall secondary to fatigue and generalized weakness. PT updating d/c recommendations to short-term rehab (<3hrs/day) to maximize his safety and independence with functional mobility prior to returning home alone. PT will continue to follow-up with pt acutely to progress mobility as tolerated. Pt would continue to benefit from skilled physical therapy services at this time while admitted and after d/c to address the below listed limitations in order to improve overall safety and independence with functional mobility.    If plan is discharge home, recommend the following: Assistance with cooking/housework;Assist for transportation;Help with stairs or ramp for entrance;A little help with walking and/or transfers;A little help with bathing/dressing/bathroom   Can travel by private vehicle     Yes  Equipment Recommendations  None recommended by PT    Recommendations for Other Services       Precautions / Restrictions Precautions Precautions: Fall Recall of Precautions/Restrictions: Intact Restrictions Weight Bearing Restrictions Per Provider Order: No     Mobility  Bed Mobility Overal bed mobility: Needs Assistance Bed Mobility: Supine to Sit     Supine to sit: Min assist     General bed mobility comments: min A needed to assist  with hip mobility and positioning to achieving upright sitting at EOB    Transfers Overall transfer level: Needs assistance Equipment used: Rolling walker (2 wheels), 1 person hand held assist Transfers: Sit to/from Stand Sit to Stand: Min assist           General transfer comment: pt requesting assistance to stand from EOB without RW initially, min A and 1HHA needed to power into standing from EOB and for stability with transitional movement    Ambulation/Gait Ambulation/Gait assistance: Contact guard assist, Min assist Gait Distance (Feet): 50 Feet Assistive device: Rolling walker (2 wheels) Gait Pattern/deviations: Step-through pattern, Trunk flexed Gait velocity: decreased     General Gait Details: pt initially requiring min assistance for stability and RW management in his room to navigate through narrow spaces, progressing to CGA in hallway; distance limited secondary to fatigue   Stairs             Wheelchair Mobility     Tilt Bed    Modified Rankin (Stroke Patients Only)       Balance Overall balance assessment: Needs assistance Sitting-balance support: Feet supported Sitting balance-Leahy Scale: Fair     Standing balance support: Bilateral upper extremity supported, Reliant on assistive device for balance Standing balance-Leahy Scale: Poor                              Communication Communication Communication: No apparent difficulties  Cognition Arousal: Alert Behavior During Therapy: WFL for tasks assessed/performed   PT - Cognitive impairments: No apparent impairments  Following commands: Intact      Cueing Cueing Techniques: Verbal cues, Gestural cues  Exercises      General Comments        Pertinent Vitals/Pain Pain Assessment Pain Assessment: No/denies pain    Home Living                          Prior Function            PT Goals (current goals can now be found  in the care plan section) Acute Rehab PT Goals PT Goal Formulation: With patient Time For Goal Achievement: 11/01/23 Potential to Achieve Goals: Good Progress towards PT goals: Progressing toward goals    Frequency    Min 2X/week      PT Plan      Co-evaluation              AM-PAC PT 6 Clicks Mobility   Outcome Measure  Help needed turning from your back to your side while in a flat bed without using bedrails?: A Little Help needed moving from lying on your back to sitting on the side of a flat bed without using bedrails?: A Little Help needed moving to and from a bed to a chair (including a wheelchair)?: A Little Help needed standing up from a chair using your arms (e.g., wheelchair or bedside chair)?: A Little Help needed to walk in hospital room?: A Little Help needed climbing 3-5 steps with a railing? : A Lot 6 Click Score: 17    End of Session Equipment Utilized During Treatment: Gait belt Activity Tolerance: Patient limited by fatigue Patient left: in chair;with call bell/phone within reach;with chair alarm set Nurse Communication: Mobility status PT Visit Diagnosis: Other abnormalities of gait and mobility (R26.89);Difficulty in walking, not elsewhere classified (R26.2);Muscle weakness (generalized) (M62.81)     Time: 9140-9085 PT Time Calculation (min) (ACUTE ONLY): 15 min  Charges:    $Gait Training: 8-22 mins PT General Charges $$ ACUTE PT VISIT: 1 Visit                     Delon DELENA KLEIN, DPT  Acute Rehabilitation Services Office [phone]    Delon HERO Shantelle Alles 10/20/2023, 9:36 AM

## 2023-10-20 NOTE — Progress Notes (Signed)
 Occupational Therapy Treatment Patient Details Name: Rodney Navarro MRN: [id number] DOB: 11-16-1924 Today's Date: 10/20/2023   History of present illness Pt is a 89 y.o. male who presented to the ED with complaints of chest pain, dyspnea and increase in productive cough, a-fib with RVR. Admitted for management of CAP, A-fib. PMH of CAD, history of cardiac cath and angioplasty, paroxysmal atrial fibrillation, hypertension, hyperlipidemia, type 2 diabetes, esophagitis, lumbar DDD, lumbar radiculitis, history of nonmelanoma skin cancer.   OT comments  Pt is supine in bed on arrival. Pleasant and agreeable to OT session. He grimaces occasionally in pain. Pt performed bed mobility with Min A for trunkal elevation and scooting forward. Pt required Min A for STS from EOB to RW and was able to perform standing grooming tasks at sink with CGA. He progressed ambulation to ~80 ft with RW and CGA this date, but reports feeling weak and fatigued overall with poor diet. Edu on importance of protein and increased intake for improved endurance and strength. Updated discharge recommendation to STR to maximize safety and IND prior to return home.  Pt returned to bed with all needs in place and will cont to require skilled acute OT services to maximize his safety and IND to return to PLOF.       If plan is discharge home, recommend the following:  A little help with walking and/or transfers;A little help with bathing/dressing/bathroom;Help with stairs or ramp for entrance   Equipment Recommendations  None recommended by OT    Recommendations for Other Services      Precautions / Restrictions Precautions Precautions: Fall Recall of Precautions/Restrictions: Intact Restrictions Weight Bearing Restrictions Per Provider Order: No       Mobility Bed Mobility Overal bed mobility: Needs Assistance Bed Mobility: Supine to Sit     Supine to sit: Min assist     General bed mobility comments: trunkal  elevation and difficulty with forward scoot    Transfers Overall transfer level: Needs assistance Equipment used: Rolling walker (2 wheels) Transfers: Sit to/from Stand Sit to Stand: Min assist           General transfer comment: Min A for lifting assist to stand from EOB, able to ambulate with RW ~80 ft with CGA     Balance Overall balance assessment: Needs assistance Sitting-balance support: Feet supported Sitting balance-Leahy Scale: Fair     Standing balance support: Single extremity supported, During functional activity Standing balance-Leahy Scale: Fair                             ADL either performed or assessed with clinical judgement   ADL Overall ADL's : Needs assistance/impaired     Grooming: Wash/dry face;Standing;Contact guard assist;Brushing hair;Oral care Grooming Details (indicate cue type and reason): standing at sink in room                             Functional mobility during ADLs: Contact guard assist;Rolling walker (2 wheels)      Extremity/Trunk Assessment              Vision       Perception     Praxis     Communication Communication Communication: No apparent difficulties   Cognition Arousal: Alert Behavior During Therapy: [facility] for tasks assessed/performed  Following commands: Intact        Cueing   Cueing Techniques: Verbal cues, Gestural cues  Exercises      Shoulder Instructions       General Comments      Pertinent Vitals/ Pain       Pain Assessment Pain Assessment: Faces Faces Pain Scale: Hurts a little bit Pain Location: all over Pain Descriptors / Indicators: Sore Pain Intervention(s): Monitored during session, Repositioned  Home Living                                          Prior Functioning/Environment              Frequency  Min 2X/week        Progress Toward Goals  OT Goals(current goals can  now be found in the care plan section)  Progress towards OT goals: Progressing toward goals  Acute Rehab OT Goals Patient Stated Goal: get stronger OT Goal Formulation: With patient Time For Goal Achievement: 11/02/23 Potential to Achieve Goals: Good  Plan      Co-evaluation                 AM-PAC OT 6 Clicks Daily Activity     Outcome Measure   Help from another person eating meals?: None Help from another person taking care of personal grooming?: None Help from another person toileting, which includes using toliet, bedpan, or urinal?: A Lot Help from another person bathing (including washing, rinsing, drying)?: A Lot Help from another person to put on and taking off regular upper body clothing?: A Little Help from another person to put on and taking off regular lower body clothing?: A Little 6 Click Score: 18    End of Session Equipment Utilized During Treatment: Gait belt;Rolling walker (2 wheels)  OT Visit Diagnosis: Other abnormalities of gait and mobility (R26.89);Unsteadiness on feet (R26.81);Muscle weakness (generalized) (M62.81);Repeated falls (R29.6)   Activity Tolerance Patient tolerated treatment well   Patient Left in bed;with call bell/phone within reach;with bed alarm set   Nurse Communication Mobility status        Time: 8451-8387 OT Time Calculation (min): 24 min  Charges: OT General Charges $OT Visit: 1 Visit OT Treatments $Self Care/Home Management : 8-22 mins $Therapeutic Activity: 8-22 mins  Semaj Kham, OTR/L  10/20/23, 4:23 PM   Caron Tardif E Nasiyah Laverdiere 10/20/2023, 4:22 PM

## 2023-10-20 NOTE — TOC Progression Note (Signed)
 Transition of Care [facility]) - Progression Note    Patient Details  Name: Rodney Navarro MRN: [id number] Date of Birth: May 20, 1924  [facility]) CM/SW Contact  Asberry CHRISTELLA Jaksch, RN Phone Number: 10/20/2023, 11:21 AM  Clinical Narrative:     Met with patient, daughter Candis, and son in law at bedside. Reviewed recommendations for rehab. Patient in agreement to bed search.   Existing PASSR FL2 sent to MD for signature Bed search initiated   Expected Discharge Plan: Skilled Nursing Facility                 Expected Discharge Plan and Services                                               Social Drivers of Health (SDOH) Interventions SDOH Screenings   Food Insecurity: No Food Insecurity (10/17/2023)  Housing: Low Risk  (10/17/2023)  Transportation Needs: No Transportation Needs (10/17/2023)  Utilities: Not At Risk (10/17/2023)  Financial Resource Strain: Low Risk  (12/30/2022)   Received from [facility]  Social Connections: Unknown (10/18/2023)  Tobacco Use: Medium Risk (10/18/2023)    Readmission Risk Interventions     No data to display

## 2023-10-20 NOTE — TOC Progression Note (Signed)
 Transition of Care [facility]) - Progression Note    Patient Details  Name: DEMON VOLANTE MRN: [id number] Date of Birth: 14-Oct-1924  Transition of [facility]) CM/SW Contact  Corean ONEIDA Haddock, RN Phone Number: 10/20/2023, 2:50 PM  Clinical Narrative:     Patient deferred bed session to daughter Bed offers presented to daughter Candis She accepts bed at [facility]  Accepted bed in [place] and notified Alfonso at [facility] Requested beverly with TOC to start auth   Expected Discharge Plan: Skilled Nursing Facility                 Expected Discharge Plan and Services                                               Social Drivers of Health (SDOH) Interventions SDOH Screenings   Food Insecurity: No Food Insecurity (10/17/2023)  Housing: Low Risk  (10/17/2023)  Transportation Needs: No Transportation Needs (10/17/2023)  Utilities: Not At Risk (10/17/2023)  Financial Resource Strain: Low Risk  (12/30/2022)   Received from [facility]  Social Connections: Unknown (10/18/2023)  Tobacco Use: Medium Risk (10/18/2023)    Readmission Risk Interventions     No data to display

## 2023-10-21 DIAGNOSIS — J189 Pneumonia, unspecified organism: Secondary | ICD-10-CM | POA: Diagnosis not present

## 2023-10-21 MED ORDER — MIRTAZAPINE 7.5 MG PO TABS: 7.5000 mg | ORAL_TABLET | Freq: Every day | ORAL | Status: AC

## 2023-10-21 MED ORDER — AMOXICILLIN-POT CLAVULANATE 875-125 MG PO TABS
1.0000 | ORAL_TABLET | Freq: Two times a day (BID) | ORAL | Status: AC
Start: 2023-10-21 — End: 2023-10-23

## 2023-10-21 NOTE — Discharge Summary (Signed)
 Physician Discharge Summary  Rodney Navarro FMW:[id number] DOB: Apr 11, 1924 DOA: 10/17/2023  PCP: [name], MD  Admit date: 10/17/2023 Discharge date: 10/21/2023  Admitted From: Home Disposition:  SNF  Recommendations for Outpatient Follow-up:  Follow up with PCP in 1-2 weeks   Home Health:No  Equipment/Devices:None   Discharge Condition:Stable  CODE STATUS:DNR  Diet recommendation: Reg  Brief/Interim Summary:   89yo with h/o CAD s/p angioplasty, afib, HTN, HLD, DM, and chronic back pain who presented on 7/20 with chest pain.  He was noted to be in afib with RVR to 130 and glucose was 358.  CXR with LLL PNA. He was started on IVF and Ceftriaxone /Azithromycin .      Discharge Diagnoses:  Principal Problem:   CAP (community acquired pneumonia) Active Problems:   Essential hypertension   Hyperlipidemia   Underweight (BMI < 18.5)   Type 2 diabetes mellitus with hyperglycemia (HCC)   Normocytic anemia   Moderate protein malnutrition (HCC)   Paroxysmal atrial fibrillation with RVR (HCC)   Protein-calorie malnutrition, severe    CAP (community acquired pneumonia) Presented with chest pain and productive cough, found to be in aifb with LLL PNA Admitted to telemetry Stable on RA Plan: Completed azithro.  Transition to oral augmentin  to complete 5 day course On room air, VSS at time of DC    Generalized weakness He did great with therapy on 7/21 and was able to ambulate very well He declined HH therapy and was likely to dc home However, he is significantly weaker on 7/22.  Weakness slowly improving but skilled nursing facility recommended by PT on 7/23.  Patient and family agreeable. Plan: SNF workup Accepted bed offer at [facility]   Discharge Instructions  Discharge Instructions     Diet - low sodium heart healthy   Complete by: As directed    Increase activity slowly   Complete by: As directed    No wound care   Complete by: As directed        Allergies as of 10/21/2023       Reactions   Alendronate Other (See Comments)   Risedronate Other (See Comments)   Tetanus Toxoids         Medication List     TAKE these medications    acetaminophen  500 MG tablet Commonly known as: TYLENOL  Take 500 mg by mouth every 6 (six) hours as needed for mild pain (pain score 1-3).   acyclovir 200 MG capsule Commonly known as: ZOVIRAX Take 200 mg by mouth 3 (three) times daily.   amoxicillin -clavulanate 875-125 MG tablet Commonly known as: AUGMENTIN  Take 1 tablet by mouth 2 (two) times daily for 2 days.   ascorbic acid  1000 MG tablet Commonly known as: VITAMIN C  Take 500 mg by mouth daily as needed (if feeling sick).   fluticasone  50 MCG/ACT nasal spray Commonly known as: FLONASE  Place 2 sprays into both nostrils daily as needed for allergies or rhinitis.   GoodSense Aspirin  81 MG chewable tablet Generic drug: aspirin  Chew 81 mg by mouth daily.   melatonin 3 MG Tabs tablet Take 3 mg by mouth at bedtime as needed (sleep).   metoprolol  succinate 50 MG 24 hr tablet Commonly known as: TOPROL -XL Take 50 mg by mouth daily.   mirtazapine  7.5 MG tablet Commonly known as: REMERON  Take 1 tablet (7.5 mg total) by mouth at bedtime.   Multi-Vitamins Tabs Take 1 tablet by mouth daily.   OPTIVE 0.5-0.9 % ophthalmic solution Generic drug: carboxymethylcellul-glycerin  Place 1  drop into both eyes 4 (four) times daily.   pantoprazole  40 MG tablet Commonly known as: PROTONIX  Take 40 mg by mouth 2 (two) times daily before a meal.   PRESERVISION AREDS 2 PO Take by mouth.   RA Vitamin B-12 TR 1000 MCG Tbcr Generic drug: Cyanocobalamin  Take 1,000 mcg by mouth daily.   traZODone  50 MG tablet Commonly known as: DESYREL  Take 50 mg by mouth at bedtime as needed for sleep.   Vitamin D 50 MCG (2000 UT) tablet Take 2,000 Units by mouth daily.        Contact information for after-discharge care     Destination     [facility]  .   Service: Skilled Nursing Contact information: [address] [place]  [address] [phone]                    Allergies  Allergen Reactions   Alendronate Other (See Comments)   Risedronate Other (See Comments)   Tetanus Toxoids     Consultations: None   Procedures/Studies: ECHOCARDIOGRAM COMPLETE Result Date: 10/18/2023    ECHOCARDIOGRAM REPORT   Patient Name:   Rodney Navarro Date of Exam: 10/18/2023 Medical Rec #:  [id number]     Height:       68.0 in Accession #:    [id number]    Weight:       115.9 lb Date of Birth:  12-11-1924    BSA:          1.621 m Patient Age:    89 years      BP:           133/63 mmHg Patient Gender: M             HR:           82 bpm. Exam Location:  [facility] Procedure: 2D Echo, Color Doppler and Cardiac Doppler (Both Spectral and Color            Flow Doppler were utilized during procedure). Indications:     Atrial Fibrillation I48.91  History:         Patient has prior history of Echocardiogram examinations, most                  recent 10/18/2015. Arrythmias:Atrial Fibrillation; Risk                  Factors:Diabetes, Dyslipidemia and Hypertension.  Sonographer:     Christopher Furnace Referring Phys:  [id number] DAVID MANUEL ORTIZ Diagnosing Phys: Marsa Dooms MD IMPRESSIONS  1. Left ventricular ejection fraction, by estimation, is 60 to 65%. The left ventricle has normal function. The left ventricle has no regional wall motion abnormalities. There is mild left ventricular hypertrophy. Indeterminate diastolic filling due to E-A fusion.  2. Right ventricular systolic function is normal. The right ventricular size is normal.  3. Left atrial size was moderately dilated.  4. Right atrial size was moderately dilated.  5. The mitral valve is normal in structure. Mild mitral valve regurgitation. No evidence of mitral stenosis.  6. The aortic valve is normal in structure. Aortic valve regurgitation is trivial. No aortic stenosis is present.  7. The  inferior vena cava is normal in size with greater than 50% respiratory variability, suggesting right atrial pressure of 3 mmHg. FINDINGS  Left Ventricle: Left ventricular ejection fraction, by estimation, is 60 to 65%. The left ventricle has normal function. The left ventricle has no regional wall motion abnormalities. Strain was performed  and the global longitudinal strain is indeterminate. The left ventricular internal cavity size was normal in size. There is mild left ventricular hypertrophy. Indeterminate diastolic filling due to E-A fusion. Right Ventricle: The right ventricular size is normal. No increase in right ventricular wall thickness. Right ventricular systolic function is normal. Left Atrium: Left atrial size was moderately dilated. Right Atrium: Right atrial size was moderately dilated. Pericardium: There is no evidence of pericardial effusion. Mitral Valve: The mitral valve is normal in structure. Mild mitral valve regurgitation. No evidence of mitral valve stenosis. Tricuspid Valve: The tricuspid valve is normal in structure. Tricuspid valve regurgitation is mild . No evidence of tricuspid stenosis. Aortic Valve: The aortic valve is normal in structure. Aortic valve regurgitation is trivial. No aortic stenosis is present. Aortic valve mean gradient measures 2.0 mmHg. Aortic valve peak gradient measures 3.4 mmHg. Aortic valve area, by VTI measures 1.71 cm. Pulmonic Valve: The pulmonic valve was normal in structure. Pulmonic valve regurgitation is not visualized. No evidence of pulmonic stenosis. Aorta: The aortic root is normal in size and structure. Venous: The inferior vena cava is normal in size with greater than 50% respiratory variability, suggesting right atrial pressure of 3 mmHg. IAS/Shunts: No atrial level shunt detected by color flow Doppler. Additional Comments: 3D was performed not requiring image post processing on an independent workstation and was indeterminate.  LEFT VENTRICLE PLAX  2D LVIDd:         3.30 cm LVIDs:         2.10 cm LV PW:         1.10 cm LV IVS:        1.60 cm LVOT diam:     2.00 cm LV SV:         30 LV SV Index:   19 LVOT Area:     3.14 cm  RIGHT VENTRICLE RV Basal diam:  3.60 cm RV Mid diam:    2.80 cm RV S prime:     11.50 cm/s TAPSE (M-mode): 1.7 cm LEFT ATRIUM              Index         RIGHT ATRIUM           Index LA diam:        4.60 cm  2.84 cm/m    RA Area:     24.10 cm LA Vol (A2C):   180.0 ml 111.06 ml/m  RA Volume:   75.80 ml  46.77 ml/m LA Vol (A4C):   64.8 ml  39.98 ml/m LA Biplane Vol: 112.0 ml 69.10 ml/m  AORTIC VALVE AV Area (Vmax):    1.80 cm AV Area (Vmean):   1.71 cm AV Area (VTI):     1.71 cm AV Vmax:           92.13 cm/s AV Vmean:          60.567 cm/s AV VTI:            0.177 m AV Peak Grad:      3.4 mmHg AV Mean Grad:      2.0 mmHg LVOT Vmax:         52.70 cm/s LVOT Vmean:        33.000 cm/s LVOT VTI:          0.096 m LVOT/AV VTI ratio: 0.55  AORTA Ao Root diam: 3.50 cm MITRAL VALVE                TRICUSPID  VALVE MV Area (PHT): 4.19 cm     TR Peak grad:   30.5 mmHg MV Decel Time: 181 msec     TR Vmax:        276.00 cm/s MV E velocity: 100.00 cm/s                             SHUNTS                             Systemic VTI:  0.10 m                             Systemic Diam: 2.00 cm Marsa Dooms MD Electronically signed by Marsa Dooms MD Signature Date/Time: 10/18/2023/1:38:37 PM    Final    DG Chest Portable 1 View Result Date: 10/17/2023 CLINICAL DATA:  Productive cough EXAM: PORTABLE CHEST 1 VIEW COMPARISON:  X-ray 09/06/2022 FINDINGS: New area of consolidative ill-defined opacity at the left lung base with tiny left effusion. Hyperinflation. No pneumothorax or edema. Normal cardiopericardial silhouette. Calcified aorta. Overlapping cardiac leads. Osteopenia. IMPRESSION: New opacity left lung base with a tiny left effusion. Infiltrate or pneumonia is possible. Recommend follow-up to confirm clearance. Electronically Signed   By:  Ranell Bring M.D.   On: 10/17/2023 16:22      Subjective:   Discharge Exam: Vitals:   10/21/23 0331 10/21/23 0835  BP: (!) 164/74 (!) 168/98  Pulse: 67 80  Resp: 20 18  Temp: 97.6 F (36.4 C) 97.9 F (36.6 C)  SpO2: 99% 98%   Vitals:   10/20/23 2011 10/21/23 0331 10/21/23 0420 10/21/23 0835  BP: 132/75 (!) 164/74  (!) 168/98  Pulse: 75 67  80  Resp: 20 20  18   Temp: 97.8 F (36.6 C) 97.6 F (36.4 C)  97.9 F (36.6 C)  TempSrc: Oral Oral  Oral  SpO2: 99% 99%  98%  Weight:   45.5 kg   Height:        General: Pt is alert, awake, not in acute distress Cardiovascular: RRR, S1/S2 +, no rubs, no gallops Respiratory: CTA bilaterally, no wheezing, no rhonchi Abdominal: Soft, NT, ND, bowel sounds + Extremities: no edema, no cyanosis    The results of significant diagnostics from this hospitalization (including imaging, microbiology, ancillary and laboratory) are listed below for reference.     Microbiology: Recent Results (from the past 240 hours)  Blood culture (routine x 2)     Status: None (Preliminary result)   Collection Time: 10/17/23  4:59 PM   Specimen: BLOOD  Result Value Ref Range Status   Specimen Description BLOOD BLOOD LEFT FOREARM  Final   Special Requests   Final    BOTTLES DRAWN AEROBIC AND ANAEROBIC Blood Culture adequate volume   Culture   Final    NO GROWTH 4 DAYS Performed at [facility], [address], [place], [place] [address]    Report Status PENDING  Incomplete  Blood culture (routine x 2)     Status: None (Preliminary result)   Collection Time: 10/17/23  4:59 PM   Specimen: BLOOD  Result Value Ref Range Status   Specimen Description BLOOD BLOOD RIGHT FOREARM  Final   Special Requests   Final    BOTTLES DRAWN AEROBIC AND ANAEROBIC Blood Culture results may not be optimal due to an inadequate volume of blood  received in culture bottles   Culture   Final    NO GROWTH 4 DAYS Performed at [facility], [address] [place], [place] [address]    Report Status PENDING  Incomplete  Expectorated Sputum Assessment w Gram Stain, Rflx to Resp Cult     Status: None   Collection Time: 10/17/23  6:28 PM   Specimen: Sputum  Result Value Ref Range Status   Specimen Description SPUTUM  Final   Special Requests  EXPSU  Final   Sputum evaluation   Final    THIS SPECIMEN IS ACCEPTABLE FOR SPUTUM CULTURE Performed at [facility], [address], [place], [place] [address]    Report Status 10/17/2023 FINAL  Final  Culture, Respiratory w Gram Stain     Status: None   Collection Time: 10/17/23  6:28 PM   Specimen: SPU  Result Value Ref Range Status   Specimen Description   Final    SPUTUM Performed at [facility], [address], [place], [place] [address]    Special Requests   Final     EXPSU Reflexed from [id number] Performed at [facility], [address] [place], [place] [address]    Gram Stain   Final    RARE WBC PRESENT, PREDOMINANTLY PMN RARE GRAM POSITIVE COCCI    Culture   Final    Normal respiratory flora-no Staph aureus or Pseudomonas seen Performed at [facility], [address] [place], [place] [address]    Report Status 10/20/2023 FINAL  Final     Labs: BNP (last 3 results) Recent Labs    10/17/23 1535  BNP 293.6*   Basic Metabolic Panel: Recent Labs  Lab 10/17/23 1535 10/18/23 0300 10/19/23 0323  NA 138 139 140  K 4.1 4.0 3.9  CL 103 103 105  CO2 24 25 23   GLUCOSE 271* 102* 106*  BUN 17 12 13   CREATININE 0.75 0.63 0.58*  CALCIUM 8.7* 8.6* 8.7*  MG 1.7  --  1.9  PHOS  --   --  2.8   Liver Function Tests: Recent Labs  Lab 10/17/23 1535 10/18/23 0300  AST 27 38  ALT 25 41  ALKPHOS 62 65  BILITOT 0.7 0.7  PROT 5.9* 5.0*  ALBUMIN 2.6* 2.4*   Recent Labs  Lab 10/17/23 1535  LIPASE 24   No results for input(s): AMMONIA in the last 168 hours. CBC: Recent Labs  Lab 10/17/23 1535 10/18/23 0300 10/19/23 0323  WBC  13.1* 9.3 9.6  NEUTROABS 11.3*  --  7.7  HGB 10.1* 9.6* 10.4*  HCT 30.8* 30.0* 32.3*  MCV 96.3 98.0 97.3  PLT 191 183 208   Cardiac Enzymes: No results for input(s): CKTOTAL, CKMB, CKMBINDEX, TROPONINI in the last 168 hours. BNP: Invalid input(s): POCBNP CBG: Recent Labs  Lab 10/18/23 1616 10/18/23 2011 10/19/23 0726 10/19/23 1107 10/19/23 1608  GLUCAP 83 141* 94 148* 119*   D-Dimer No results for input(s): DDIMER in the last 72 hours. Hgb A1c No results for input(s): HGBA1C in the last 72 hours. Lipid Profile No results for input(s): CHOL, HDL, LDLCALC, TRIG, CHOLHDL, LDLDIRECT in the last 72 hours. Thyroid function studies No results for input(s): TSH, T4TOTAL, T3FREE, THYROIDAB in the last 72 hours.  Invalid input(s): FREET3 Anemia work up No results for input(s): VITAMINB12, FOLATE, FERRITIN, TIBC, IRON, RETICCTPCT in the last 72 hours. Urinalysis    Component Value Date/Time   COLORURINE AMBER (A) 10/17/2023 1828  APPEARANCEUR HAZY (A) 10/17/2023 1828   APPEARANCEUR Clear 12/04/2012 0916   LABSPEC 1.026 10/17/2023 1828   LABSPEC 1.014 12/04/2012 0916   PHURINE 5.0 10/17/2023 1828   GLUCOSEU >=500 (A) 10/17/2023 1828   GLUCOSEU 50 mg/dL 90/92/7985 9083   HGBUR NEGATIVE 10/17/2023 1828   BILIRUBINUR NEGATIVE 10/17/2023 1828   BILIRUBINUR Negative 12/04/2012 0916   KETONESUR NEGATIVE 10/17/2023 1828   PROTEINUR 30 (A) 10/17/2023 1828   NITRITE NEGATIVE 10/17/2023 1828   LEUKOCYTESUR NEGATIVE 10/17/2023 1828   LEUKOCYTESUR Negative 12/04/2012 0916   Sepsis Labs Recent Labs  Lab 10/17/23 1535 10/18/23 0300 10/19/23 0323  WBC 13.1* 9.3 9.6   Microbiology Recent Results (from the past 240 hours)  Blood culture (routine x 2)     Status: None (Preliminary result)   Collection Time: 10/17/23  4:59 PM   Specimen: BLOOD  Result Value Ref Range Status   Specimen Description BLOOD BLOOD LEFT FOREARM  Final    Special Requests   Final    BOTTLES DRAWN AEROBIC AND ANAEROBIC Blood Culture adequate volume   Culture   Final    NO GROWTH 4 DAYS Performed at [facility], [address], [place], [place] [address]    Report Status PENDING  Incomplete  Blood culture (routine x 2)     Status: None (Preliminary result)   Collection Time: 10/17/23  4:59 PM   Specimen: BLOOD  Result Value Ref Range Status   Specimen Description BLOOD BLOOD RIGHT FOREARM  Final   Special Requests   Final    BOTTLES DRAWN AEROBIC AND ANAEROBIC Blood Culture results may not be optimal due to an inadequate volume of blood received in culture bottles   Culture   Final    NO GROWTH 4 DAYS Performed at [facility], [address], [place], [place] [address]    Report Status PENDING  Incomplete  Expectorated Sputum Assessment w Gram Stain, Rflx to Resp Cult     Status: None   Collection Time: 10/17/23  6:28 PM   Specimen: Sputum  Result Value Ref Range Status   Specimen Description SPUTUM  Final   Special Requests  EXPSU  Final   Sputum evaluation   Final    THIS SPECIMEN IS ACCEPTABLE FOR SPUTUM CULTURE Performed at [facility], [address], [place], [place] [address]    Report Status 10/17/2023 FINAL  Final  Culture, Respiratory w Gram Stain     Status: None   Collection Time: 10/17/23  6:28 PM   Specimen: SPU  Result Value Ref Range Status   Specimen Description   Final    SPUTUM Performed at [facility], [address] [place], [place] [address]    Special Requests   Final     EXPSU Reflexed from [id number] Performed at [facility], [address] [place], [place] [address]    Gram Stain   Final    RARE WBC PRESENT, PREDOMINANTLY PMN RARE GRAM POSITIVE COCCI    Culture   Final    Normal respiratory flora-no Staph aureus or Pseudomonas seen Performed at [facility], [address], [place], [place] [address]    Report Status 10/20/2023 FINAL   Final     Time coordinating discharge: 40 minutes   SIGNED:   Calvin KATHEE Robson, MD  [facility] 10/21/2023, 8:55 AM Pager   If 7PM-7AM, please contact night-coverage

## 2023-10-21 NOTE — TOC Transition Note (Signed)
 Transition of Care [facility]) - Discharge Note   Patient Details  Name: Rodney Navarro MRN: [id number] Date of Birth: [birth year]  [facility] CM/SW Contact:  Corean ONEIDA Haddock, RN Phone Number: 10/21/2023, 9:18 AM   Clinical Narrative:     Auth received for [facility]  Patient will DC to: [facility] Anticipated DC date: 10/21/23  Family notified: Daughter Candis Briar by: Daughter Candis and son in law  Per MD patient ready for DC to . RN, , patient's family, and facility notified of DC. Discharge Summary sent to facility. RN given number for report. DC packet on chart.  TOC signing off.         Patient Goals and CMS Choice            Discharge Placement                       Discharge Plan and Services Additional resources added to the After Visit Summary for                                       Social Drivers of Health (SDOH) Interventions SDOH Screenings   Food Insecurity: No Food Insecurity (10/17/2023)  Housing: Low Risk  (10/17/2023)  Transportation Needs: No Transportation Needs (10/17/2023)  Utilities: Not At Risk (10/17/2023)  Financial Resource Strain: Low Risk  (12/30/2022)   Received from [facility]  Social Connections: Unknown (10/18/2023)  Tobacco Use: Medium Risk (10/18/2023)     Readmission Risk Interventions     No data to display

## 2023-10-21 NOTE — Plan of Care (Signed)

## 2023-10-21 NOTE — Progress Notes (Signed)
 Writer attempted to call report to nurse receiving patient at [facility]. Patient going to room 112. Writer unable to get a hold of nurse receiving patient at this time.

## 2023-10-22 ENCOUNTER — Non-Acute Institutional Stay (SKILLED_NURSING_FACILITY): Payer: Self-pay | Admitting: Student

## 2023-10-22 ENCOUNTER — Encounter: Payer: Self-pay | Admitting: Student

## 2023-10-22 DIAGNOSIS — I480 Paroxysmal atrial fibrillation: Secondary | ICD-10-CM | POA: Diagnosis not present

## 2023-10-22 DIAGNOSIS — J189 Pneumonia, unspecified organism: Secondary | ICD-10-CM | POA: Diagnosis not present

## 2023-10-22 DIAGNOSIS — M810 Age-related osteoporosis without current pathological fracture: Secondary | ICD-10-CM

## 2023-10-22 DIAGNOSIS — E43 Unspecified severe protein-calorie malnutrition: Secondary | ICD-10-CM

## 2023-10-22 DIAGNOSIS — I10 Essential (primary) hypertension: Secondary | ICD-10-CM | POA: Diagnosis not present

## 2023-10-22 DIAGNOSIS — E1165 Type 2 diabetes mellitus with hyperglycemia: Secondary | ICD-10-CM

## 2023-10-22 DIAGNOSIS — E1151 Type 2 diabetes mellitus with diabetic peripheral angiopathy without gangrene: Secondary | ICD-10-CM

## 2023-10-22 LAB — CULTURE, BLOOD (ROUTINE X 2)
Culture: NO GROWTH
Culture: NO GROWTH
Special Requests: ADEQUATE

## 2023-10-22 NOTE — Progress Notes (Signed)
 Provider:  Dr Richerd Brigham Location:  Other [place].  Nursing Home Room Number: [facility] Place of Service:  SNF ([phone])  PCP: [name], MD Patient Care Team: [name], MD as PCP - General (Internal Medicine)  Extended Emergency Contact Information Primary Emergency Contact: Troxler,Joan Address: [address] [place], [place] [address] [place] Phone: [phone] Relation: Daughter  Code Status: DNR Goals of Care: Advanced Directive information    10/17/2023    3:32 PM  Advanced Directives  Does Patient Have a Medical Advance Directive? No  Would patient like information on creating a medical advance directive? No - Patient declined      No chief complaint on file.   HPI: Patient is a 89 y.o. male seen today for admission to [facility] History of Present Illness The patient is a 89 year old with coronary artery disease, atrial fibrillation, and pneumonia who presents for post-acute rehabilitation.  He has experienced generalized weakness following a recent hospitalization for pneumonia. Initially, he was able to ambulate after therapy on July 21, but significant weakness on July 22 and 23 led to his acceptance at [facility] for post-acute rehabilitation. During his previous hospitalization, he was treated for left lower lobe pneumonia with IV fluids, ceftriaxone , azithromycin , and later transitioned to Augmentin  to complete a five-day course.  He is visually and hearing impaired. He has allergies to alendronate, rosiglitazone, and tetanus toxoids. He has a history of herpes for which he takes acyclovir once daily, although it was noted to be prescribed three times a day during this visit.  He reports a decreased appetite and weight loss since his wife's death, and he is currently on mirtazapine  7.5 mg nightly to help with appetite. He also takes aspirin , Flonase , melatonin, metoprolol , Preservision, Protonix ,  trazodone , vitamin D, and vitamin B12. He drinks Ensure daily for nutritional supplementation.  He lives in [place] and has been taking care of himself since his third wife died in Nov 07, 2000. He used to work for [employer] and retired at 22, but continued working until he was 76. He enjoys playing golf and has a history of skin cancer.  No issues with swallowing currently, but he has not had a bowel movement since arriving at the facility. He uses a walker at home and is concerned about maintaining his independence, including driving. He wants to 'get out of here as quick as I can' and return home.  Social History - Employment: Worked for [employer] (From age 14 to 44), Side business (Until age 88) - Partner Status: Widowed - Living Situation: Lives alone in [place], daughter nearby provides assistance - Patient has a daughter who helps with care and manages finances. He is visually and hearing impaired, and has a history of herpes managed with medication. He is a [place] and expresses readiness for end-of-life. He enjoys playing golf and has a history of skin cancer. He is currently in a rehab facility for post-acute care after pneumonia and weakness.  Past Medical History:  Diagnosis Date   A-fib (HCC)    Diabetes mellitus without complication (HCC)    Hyperlipidemia    Hypertension    Osteoporosis    Past Surgical History:  Procedure Laterality Date   CARDIAC CATHETERIZATION     CHOLECYSTECTOMY     excision skin cancer left forearm  03/31/2019   SHOULDER ARTHROCENTESIS Right     reports that he has  quit smoking. His smoking use included cigarettes. He has never used smokeless tobacco. He reports that he does not drink alcohol  and does not use drugs. Social History   Socioeconomic History   Marital status: Married    Spouse name: Not on file   Number of children: Not on file   Years of education: Not on file   Highest education level: Not on file  Occupational  History   Occupation: retired  Tobacco Use   Smoking status: Former    Types: Cigarettes   Smokeless tobacco: Never  Substance and Sexual Activity   Alcohol  use: No   Drug use: No   Sexual activity: Not on file  Other Topics Concern   Not on file  Social History Narrative   Not on file   Social Drivers of Health   Financial Resource Strain: Low Risk  (12/30/2022)   Received from [facility]   Overall Financial Resource Strain (CARDIA)    Difficulty of Paying Living Expenses: Not hard at all  Food Insecurity: No Food Insecurity (10/17/2023)   Hunger Vital Sign    Worried About Running Out of Food in the Last Year: Never true    Ran Out of Food in the Last Year: Never true  Transportation Needs: No Transportation Needs (10/17/2023)   PRAPARE - Administrator, Civil Service (Medical): No    Lack of Transportation (Non-Medical): No  Physical Activity: Not on file  Stress: Not on file  Social Connections: Unknown (10/18/2023)   Social Connection and Isolation Panel    Frequency of Communication with Friends and Family: More than three times a week    Frequency of Social Gatherings with Friends and Family: More than three times a week    Attends Religious Services: 1 to 4 times per year    Active Member of [employer] or Organizations: Yes    Attends Banker Meetings: 1 to 4 times per year    Marital Status: Not on file  Intimate Partner Violence: Not At Risk (10/17/2023)   Humiliation, Afraid, Rape, and Kick questionnaire    Fear of Current or Ex-Partner: No    Emotionally Abused: No    Physically Abused: No    Sexually Abused: No    Functional Status Survey:    Family History  Problem Relation Age of Onset   Heart disease Father     Health Maintenance  Topic Date Due   Medicare Annual Wellness (AWV)  Never done   FOOT EXAM  Never done   OPHTHALMOLOGY EXAM  Never done   DTaP/Tdap/Td (1 - Tdap) Never done   Zoster Vaccines-  Shingrix (1 of 2) 03/08/1944   COVID-19 Vaccine (3 - Pfizer risk series) 07/05/2019   INFLUENZA VACCINE  10/29/2023   HEMOGLOBIN A1C  04/18/2024   Pneumococcal Vaccine: 50+ Years  Completed   Hepatitis B Vaccines  Aged Out   HPV VACCINES  Aged Out   Meningococcal B Vaccine  Aged Out    Allergies  Allergen Reactions   Alendronate Other (See Comments)   Risedronate Other (See Comments)   Tetanus Toxoids     Outpatient Encounter Medications as of 10/22/2023  Medication Sig   acetaminophen  (TYLENOL ) 500 MG tablet Take 500 mg by mouth every 6 (six) hours as needed for mild pain (pain score 1-3).   acyclovir (ZOVIRAX) 200 MG capsule Take 200 mg by mouth 3 (three) times daily.   amoxicillin -clavulanate (AUGMENTIN ) 875-125 MG tablet Take 1 tablet  by mouth 2 (two) times daily for 2 days.   aspirin  (GOODSENSE ASPIRIN ) 81 MG chewable tablet Chew 81 mg by mouth daily.   carboxymethylcellulose (REFRESH PLUS) 0.5 % SOLN Place 1 drop into both eyes 4 (four) times daily.   Cholecalciferol (VITAMIN D) 2000 units tablet Take 2,000 Units by mouth daily.   Cyanocobalamin  (RA VITAMIN B-12 TR) 1000 MCG TBCR Take 1,000 mcg by mouth daily.   fluticasone  (FLONASE ) 50 MCG/ACT nasal spray Place 2 sprays into both nostrils daily as needed for allergies or rhinitis.   Melatonin 3 MG TABS Take 3 mg by mouth at bedtime as needed (sleep).   metoprolol  succinate (TOPROL -XL) 50 MG 24 hr tablet Take 50 mg by mouth daily.   mirtazapine  (REMERON ) 7.5 MG tablet Take 1 tablet (7.5 mg total) by mouth at bedtime.   Multiple Vitamin (MULTI-VITAMINS) TABS Take 1 tablet by mouth daily.   Multiple Vitamins-Minerals (PRESERVISION AREDS 2 PO) Take by mouth.   pantoprazole  (PROTONIX ) 40 MG tablet Take 40 mg by mouth 2 (two) times daily before a meal.   traZODone  (DESYREL ) 50 MG tablet Take 50 mg by mouth at bedtime as needed for sleep.   ascorbic acid  (VITAMIN C ) 1000 MG tablet Take 500 mg by mouth daily as needed (if feeling  sick). (Patient not taking: Reported on 10/22/2023)   OPTIVE 0.5-0.9 % ophthalmic solution Place 1 drop into both eyes 4 (four) times daily. (Patient not taking: Reported on 10/22/2023)   No facility-administered encounter medications on file as of 10/22/2023.    Review of Systems  Vitals:   10/22/23 0902  BP: (!) 167/87  Pulse: 88  Resp: 15  Temp: 98 F (36.7 C)  SpO2: 92%  Weight: 114 lb 3.2 oz (51.8 kg)  Height: 5' 8 (1.727 m)   Body mass index is 17.36 kg/m. Physical Exam Constitutional:      Comments: Thin, Frail  Cardiovascular:     Rate and Rhythm: Rhythm irregular.     Pulses: Normal pulses.  Pulmonary:     Effort: Pulmonary effort is normal.  Abdominal:     General: Abdomen is flat.  Neurological:     Mental Status: He is alert.     Comments: Oriented to self, [facility], July, 2025     Labs reviewed: Basic Metabolic Panel: Recent Labs    10/17/23 1535 10/18/23 0300 10/19/23 0323  NA 138 139 140  K 4.1 4.0 3.9  CL 103 103 105  CO2 24 25 23   GLUCOSE 271* 102* 106*  BUN 17 12 13   CREATININE 0.75 0.63 0.58*  CALCIUM 8.7* 8.6* 8.7*  MG 1.7  --  1.9  PHOS  --   --  2.8   Liver Function Tests: Recent Labs    10/17/23 1535 10/18/23 0300  AST 27 38  ALT 25 41  ALKPHOS 62 65  BILITOT 0.7 0.7  PROT 5.9* 5.0*  ALBUMIN 2.6* 2.4*   Recent Labs    10/17/23 1535  LIPASE 24   No results for input(s): AMMONIA in the last 8760 hours. CBC: Recent Labs    10/17/23 1535 10/18/23 0300 10/19/23 0323  WBC 13.1* 9.3 9.6  NEUTROABS 11.3*  --  7.7  HGB 10.1* 9.6* 10.4*  HCT 30.8* 30.0* 32.3*  MCV 96.3 98.0 97.3  PLT 191 183 208   Cardiac Enzymes: No results for input(s): CKTOTAL, CKMB, CKMBINDEX, TROPONINI in the last 8760 hours. BNP: Invalid input(s): POCBNP Lab Results  Component Value Date  HGBA1C 6.2 (H) 10/17/2023   No results found for: TSH No results found for: VITAMINB12 No results found for: FOLATE No results  found for: IRON, TIBC, FERRITIN  Imaging and Procedures obtained prior to SNF admission: ECHOCARDIOGRAM COMPLETE Result Date: 10/18/2023    ECHOCARDIOGRAM REPORT   Patient Name:   JERYN CERNEY Date of Exam: 10/18/2023 Medical Rec #:  [id number]     Height:       68.0 in Accession #:    [id number]    Weight:       115.9 lb Date of Birth:  1925-02-16    BSA:          1.621 m Patient Age:    89 years      BP:           133/63 mmHg Patient Gender: M             HR:           82 bpm. Exam Location:  [facility] Procedure: 2D Echo, Color Doppler and Cardiac Doppler (Both Spectral and Color            Flow Doppler were utilized during procedure). Indications:     Atrial Fibrillation I48.91  History:         Patient has prior history of Echocardiogram examinations, most                  recent 10/18/2015. Arrythmias:Atrial Fibrillation; Risk                  Factors:Diabetes, Dyslipidemia and Hypertension.  Sonographer:     Christopher Furnace Referring Phys:  [id number] DAVID MANUEL ORTIZ Diagnosing Phys: Marsa Dooms MD IMPRESSIONS  1. Left ventricular ejection fraction, by estimation, is 60 to 65%. The left ventricle has normal function. The left ventricle has no regional wall motion abnormalities. There is mild left ventricular hypertrophy. Indeterminate diastolic filling due to E-A fusion.  2. Right ventricular systolic function is normal. The right ventricular size is normal.  3. Left atrial size was moderately dilated.  4. Right atrial size was moderately dilated.  5. The mitral valve is normal in structure. Mild mitral valve regurgitation. No evidence of mitral stenosis.  6. The aortic valve is normal in structure. Aortic valve regurgitation is trivial. No aortic stenosis is present.  7. The inferior vena cava is normal in size with greater than 50% respiratory variability, suggesting right atrial pressure of 3 mmHg. FINDINGS  Left Ventricle: Left ventricular ejection fraction, by estimation, is 60 to 65%. The left  ventricle has normal function. The left ventricle has no regional wall motion abnormalities. Strain was performed and the global longitudinal strain is indeterminate. The left ventricular internal cavity size was normal in size. There is mild left ventricular hypertrophy. Indeterminate diastolic filling due to E-A fusion. Right Ventricle: The right ventricular size is normal. No increase in right ventricular wall thickness. Right ventricular systolic function is normal. Left Atrium: Left atrial size was moderately dilated. Right Atrium: Right atrial size was moderately dilated. Pericardium: There is no evidence of pericardial effusion. Mitral Valve: The mitral valve is normal in structure. Mild mitral valve regurgitation. No evidence of mitral valve stenosis. Tricuspid Valve: The tricuspid valve is normal in structure. Tricuspid valve regurgitation is mild . No evidence of tricuspid stenosis. Aortic Valve: The aortic valve is normal in structure. Aortic valve regurgitation is trivial. No aortic stenosis is present. Aortic valve mean gradient measures 2.0 mmHg. Aortic valve peak gradient  measures 3.4 mmHg. Aortic valve area, by VTI measures 1.71 cm. Pulmonic Valve: The pulmonic valve was normal in structure. Pulmonic valve regurgitation is not visualized. No evidence of pulmonic stenosis. Aorta: The aortic root is normal in size and structure. Venous: The inferior vena cava is normal in size with greater than 50% respiratory variability, suggesting right atrial pressure of 3 mmHg. IAS/Shunts: No atrial level shunt detected by color flow Doppler. Additional Comments: 3D was performed not requiring image post processing on an independent workstation and was indeterminate.  LEFT VENTRICLE PLAX 2D LVIDd:         3.30 cm LVIDs:         2.10 cm LV PW:         1.10 cm LV IVS:        1.60 cm LVOT diam:     2.00 cm LV SV:         30 LV SV Index:   19 LVOT Area:     3.14 cm  RIGHT VENTRICLE RV Basal diam:  3.60 cm RV Mid  diam:    2.80 cm RV S prime:     11.50 cm/s TAPSE (M-mode): 1.7 cm LEFT ATRIUM              Index         RIGHT ATRIUM           Index LA diam:        4.60 cm  2.84 cm/m    RA Area:     24.10 cm LA Vol (A2C):   180.0 ml 111.06 ml/m  RA Volume:   75.80 ml  46.77 ml/m LA Vol (A4C):   64.8 ml  39.98 ml/m LA Biplane Vol: 112.0 ml 69.10 ml/m  AORTIC VALVE AV Area (Vmax):    1.80 cm AV Area (Vmean):   1.71 cm AV Area (VTI):     1.71 cm AV Vmax:           92.13 cm/s AV Vmean:          60.567 cm/s AV VTI:            0.177 m AV Peak Grad:      3.4 mmHg AV Mean Grad:      2.0 mmHg LVOT Vmax:         52.70 cm/s LVOT Vmean:        33.000 cm/s LVOT VTI:          0.096 m LVOT/AV VTI ratio: 0.55  AORTA Ao Root diam: 3.50 cm MITRAL VALVE                TRICUSPID VALVE MV Area (PHT): 4.19 cm     TR Peak grad:   30.5 mmHg MV Decel Time: 181 msec     TR Vmax:        276.00 cm/s MV E velocity: 100.00 cm/s                             SHUNTS                             Systemic VTI:  0.10 m                             Systemic Diam: 2.00 cm Marsa Dooms MD Electronically signed by Marsa Dooms  MD Signature Date/Time: 10/18/2023/1:38:37 PM    Final     Assessment/Plan Atrial fibrillation with RVR Atrial fibrillation with rapid ventricular response, heart rate 130 bpm, managed with metoprolol . - Continue metoprolol  for heart rate control.  Pneumonia, left lower lobe Left lower lobe pneumonia diagnosed on 7/20, initially treated with IV fluids, ceftriaxone , and azithromycin , transitioned to Augmentin . Reports generalized weakness post-treatment. - Complete Augmentin  course for pneumonia.  Generalized weakness Generalized weakness, particularly on 7/22 and 7/23, likely related to recent pneumonia and hospitalization, leading to acceptance at [facility] for post-acute rehabilitation. - Continue rehabilitation at [facility] to improve strength and mobility.  Diabetes mellitus Diabetes mellitus with  recent glucose level of 358 mg/dL.  Severe Protein Deficiency Loss of appetite and weight loss since wife's passing, currently on mirtazapine  7.5 mg nightly, consumes Ensure daily for additional protein intake. - Continue mirtazapine  7.5 mg nightly. - Encourage consumption of Ensure for protein supplementation. - Recommend 30 grams of protein per meal.  Visual impairment Visual impairment noted, wears glasses  Hearing impairment Hearing impairment noted  Continence issues Continence issues with bladder and stool noted.  Herpes simplex virus Herpes simplex virus managed with acyclovir, currently taking acyclovir once daily, though prescription indicates three times daily. - Adjust acyclovir dosage to once daily as per usual regimen.   Family/ staff Communication: nursing  Labs/tests ordered: CBC, BMP

## 2023-11-04 ENCOUNTER — Non-Acute Institutional Stay (SKILLED_NURSING_FACILITY): Admitting: Nurse Practitioner

## 2023-11-04 ENCOUNTER — Encounter: Payer: Self-pay | Admitting: Nurse Practitioner

## 2023-11-04 DIAGNOSIS — E43 Unspecified severe protein-calorie malnutrition: Secondary | ICD-10-CM

## 2023-11-04 DIAGNOSIS — J189 Pneumonia, unspecified organism: Secondary | ICD-10-CM | POA: Diagnosis not present

## 2023-11-04 DIAGNOSIS — I4891 Unspecified atrial fibrillation: Secondary | ICD-10-CM | POA: Diagnosis not present

## 2023-11-04 DIAGNOSIS — I10 Essential (primary) hypertension: Secondary | ICD-10-CM

## 2023-11-04 DIAGNOSIS — E1151 Type 2 diabetes mellitus with diabetic peripheral angiopathy without gangrene: Secondary | ICD-10-CM

## 2023-11-04 NOTE — Progress Notes (Signed)
 Location:  Other [facility].  Nursing Home Room Number: [facility] [id number] Place of Service:  SNF [phone]) Rodney An, NP  PCP: Rodney Oneil FALCON, MD  Patient Care Team: Rodney Oneil FALCON, MD as PCP - General (Internal Medicine)  Extended Emergency Contact Information Primary Emergency Contact: Navarro,Rodney Address: [address] [place], [place] [address] [place] Mobile Phone: [phone] Relation: Daughter  Goals of care: Advanced Directive information    10/17/2023    3:32 PM  Advanced Directives  Does Patient Have a Medical Advance Directive? No  Would patient like information on creating a medical advance directive? No - Patient declined     Chief Complaint  Patient presents with   Discharge Note    Discharge    HPI:  Pt is a 89 y.o. male seen today for Discharge home. Pt with hx of CAD, afib, htn, hyperlipidemia, DM. He is at [facility] for short term rehab due to weakness and debility after hospitalization for LLL pneumonia. He completed course of azithromycin . He has had decrease appetite and poor oral intake which is ongoing- taking mirtazapine  for this.  Reports some constipation but managed- he takes medication at home that helps with this.   He lives alone but family lives nearby.     Past Medical History:  Diagnosis Date   A-fib (HCC)    Diabetes mellitus without complication (HCC)    Hyperlipidemia    Hypertension    Osteoporosis    Past Surgical History:  Procedure Laterality Date   CARDIAC CATHETERIZATION     CHOLECYSTECTOMY     excision skin cancer left forearm  03/31/2019   SHOULDER ARTHROCENTESIS Right     Allergies  Allergen Reactions   Alendronate Other (See Comments)   Risedronate Other (See Comments)   Tetanus Toxoids     Outpatient Encounter Medications as of 11/04/2023  Medication Sig   acetaminophen  (TYLENOL ) 500 MG tablet Take 500 mg by mouth every 6 (six) hours as needed for mild pain (pain score  1-3).   acyclovir (ZOVIRAX) 200 MG capsule Take 200 mg by mouth 3 (three) times daily. (Patient taking differently: Take 200 mg by mouth daily.)   aspirin  (GOODSENSE ASPIRIN ) 81 MG chewable tablet Chew 81 mg by mouth daily.   carboxymethylcellulose (REFRESH PLUS) 0.5 % SOLN Place 1 drop into both eyes 4 (four) times daily.   Cholecalciferol (VITAMIN D) 2000 units tablet Take 2,000 Units by mouth daily.   Cyanocobalamin  (RA VITAMIN B-12 TR) 1000 MCG TBCR Take 1,000 mcg by mouth daily.   fluticasone  (FLONASE ) 50 MCG/ACT nasal spray Place 2 sprays into both nostrils daily as needed for allergies or rhinitis.   Melatonin 3 MG TABS Take 3 mg by mouth at bedtime as needed (sleep).   metoprolol  succinate (TOPROL -XL) 50 MG 24 hr tablet Take 50 mg by mouth daily.   mirtazapine  (REMERON ) 7.5 MG tablet Take 1 tablet (7.5 mg total) by mouth at bedtime.   Multiple Vitamin (MULTI-VITAMINS) TABS Take 1 tablet by mouth daily.   Multiple Vitamins-Minerals (PRESERVISION AREDS 2 PO) Take by mouth.   ondansetron  (ZOFRAN ) 8 MG tablet Take 8 mg by mouth every 6 (six) hours as needed for nausea or vomiting.   pantoprazole  (PROTONIX ) 40 MG tablet Take 40 mg by mouth 2 (two) times daily before a meal.   polyethylene glycol (MIRALAX / GLYCOLAX) 17 g packet Take 17 g by mouth daily as needed.  traZODone  (DESYREL ) 50 MG tablet Take 50 mg by mouth at bedtime as needed for sleep.   No facility-administered encounter medications on file as of 11/04/2023.    Review of Systems  Constitutional:  Negative for activity change, appetite change, fatigue and unexpected weight change.  HENT:  Negative for congestion and hearing loss.   Eyes: Negative.   Respiratory:  Positive for cough. Negative for shortness of breath.   Cardiovascular:  Negative for chest pain, palpitations and leg swelling.  Gastrointestinal:  Positive for constipation. Negative for abdominal pain and diarrhea.  Genitourinary:  Negative for difficulty  urinating and dysuria.  Musculoskeletal:  Negative for arthralgias and myalgias.  Skin:  Negative for color change and wound.  Neurological:  Negative for dizziness and weakness.  Psychiatric/Behavioral:  Negative for agitation, behavioral problems and confusion.      Immunization History  Administered Date(s) Administered   Influenza Inj Mdck Quad Pf 12/15/2021   Influenza, Mdck, Trivalent,PF 6+ MOS(egg free) 12/30/2022   PFIZER(Purple Top)SARS-COV-2 Vaccination 05/14/2019, 06/07/2019   PNEUMOCOCCAL CONJUGATE-20 10/23/2023   Pneumococcal Conjugate-13 01/26/2016   Pneumococcal Polysaccharide-23 11/19/2008, 01/03/2017   RSV,unspecified 10/23/2023   Zoster, Live 08/20/2010   Pertinent  Health Maintenance Due  Topic Date Due   FOOT EXAM  Never done   OPHTHALMOLOGY EXAM  Never done   INFLUENZA VACCINE  10/29/2023   HEMOGLOBIN A1C  04/18/2024       No data to display         Functional Status Survey:    Vitals:   11/04/23 0852  BP: 139/67  Pulse: 76  Resp: 18  Temp: 97.8 F (36.6 C)  SpO2: 93%  Weight: 107 lb (48.5 kg)  Height: 5' 8 (1.727 m)   Body mass index is 16.27 kg/m. Physical Exam Constitutional:      General: He is not in acute distress.    Appearance: He is well-developed. He is not diaphoretic.  HENT:     Head: Normocephalic and atraumatic.     Right Ear: External ear normal.     Left Ear: External ear normal.     Mouth/Throat:     Pharynx: No oropharyngeal exudate.  Eyes:     Conjunctiva/sclera: Conjunctivae normal.     Pupils: Pupils are equal, round, and reactive to light.  Cardiovascular:     Rate and Rhythm: Normal rate and regular rhythm.     Heart sounds: Normal heart sounds.  Pulmonary:     Effort: Pulmonary effort is normal.     Breath sounds: Normal breath sounds.  Abdominal:     General: Bowel sounds are normal.     Palpations: Abdomen is soft.  Musculoskeletal:        General: No tenderness.     Cervical back: Normal range of  motion and neck supple.     Right lower leg: No edema.     Left lower leg: No edema.  Skin:    General: Skin is warm and dry.  Neurological:     Mental Status: He is alert and oriented to person, place, and time.     Motor: Weakness present.     Gait: Gait abnormal.     Labs reviewed: Recent Labs    10/17/23 1535 10/18/23 0300 10/19/23 0323  NA 138 139 140  K 4.1 4.0 3.9  CL 103 103 105  CO2 24 25 23   GLUCOSE 271* 102* 106*  BUN 17 12 13   CREATININE 0.75 0.63 0.58*  CALCIUM 8.7* 8.6* 8.7*  MG 1.7  --  1.9  PHOS  --   --  2.8   Recent Labs    10/17/23 1535 10/18/23 0300  AST 27 38  ALT 25 41  ALKPHOS 62 65  BILITOT 0.7 0.7  PROT 5.9* 5.0*  ALBUMIN 2.6* 2.4*   Recent Labs    10/17/23 1535 10/18/23 0300 10/19/23 0323  WBC 13.1* 9.3 9.6  NEUTROABS 11.3*  --  7.7  HGB 10.1* 9.6* 10.4*  HCT 30.8* 30.0* 32.3*  MCV 96.3 98.0 97.3  PLT 191 183 208   No results found for: TSH Lab Results  Component Value Date   HGBA1C 6.2 (H) 10/17/2023   No results found for: CHOL, HDL, LDLCALC, LDLDIRECT, TRIG, CHOLHDL  Significant Diagnostic Results in last 30 days:  ECHOCARDIOGRAM COMPLETE Result Date: 10/18/2023    ECHOCARDIOGRAM REPORT   Patient Name:   Rodney Navarro Date of Exam: 10/18/2023 Medical Rec #:  [id number]     Height:       68.0 in Accession #:    [id number]    Weight:       115.9 lb Date of Birth:  1924-07-31    BSA:          1.621 m Patient Age:    89 years      BP:           133/63 mmHg Patient Gender: M             HR:           82 bpm. Exam Location:  [facility] Procedure: 2D Echo, Color Doppler and Cardiac Doppler (Both Spectral and Color            Flow Doppler were utilized during procedure). Indications:     Atrial Fibrillation I48.91  History:         Patient has prior history of Echocardiogram examinations, most                  recent 10/18/2015. Arrythmias:Atrial Fibrillation; Risk                  Factors:Diabetes, Dyslipidemia and Hypertension.   Sonographer:     Christopher Furnace Referring Phys:  [id number] DAVID MANUEL ORTIZ Diagnosing Phys: Marsa Dooms MD IMPRESSIONS  1. Left ventricular ejection fraction, by estimation, is 60 to 65%. The left ventricle has normal function. The left ventricle has no regional wall motion abnormalities. There is mild left ventricular hypertrophy. Indeterminate diastolic filling due to E-A fusion.  2. Right ventricular systolic function is normal. The right ventricular size is normal.  3. Left atrial size was moderately dilated.  4. Right atrial size was moderately dilated.  5. The mitral valve is normal in structure. Mild mitral valve regurgitation. No evidence of mitral stenosis.  6. The aortic valve is normal in structure. Aortic valve regurgitation is trivial. No aortic stenosis is present.  7. The inferior vena cava is normal in size with greater than 50% respiratory variability, suggesting right atrial pressure of 3 mmHg. FINDINGS  Left Ventricle: Left ventricular ejection fraction, by estimation, is 60 to 65%. The left ventricle has normal function. The left ventricle has no regional wall motion abnormalities. Strain was performed and the global longitudinal strain is indeterminate. The left ventricular internal cavity size was normal in size. There is mild left ventricular hypertrophy. Indeterminate diastolic filling due to E-A fusion. Right Ventricle: The right ventricular size is normal. No increase in right ventricular wall thickness. Right ventricular  systolic function is normal. Left Atrium: Left atrial size was moderately dilated. Right Atrium: Right atrial size was moderately dilated. Pericardium: There is no evidence of pericardial effusion. Mitral Valve: The mitral valve is normal in structure. Mild mitral valve regurgitation. No evidence of mitral valve stenosis. Tricuspid Valve: The tricuspid valve is normal in structure. Tricuspid valve regurgitation is mild . No evidence of tricuspid stenosis. Aortic  Valve: The aortic valve is normal in structure. Aortic valve regurgitation is trivial. No aortic stenosis is present. Aortic valve mean gradient measures 2.0 mmHg. Aortic valve peak gradient measures 3.4 mmHg. Aortic valve area, by VTI measures 1.71 cm. Pulmonic Valve: The pulmonic valve was normal in structure. Pulmonic valve regurgitation is not visualized. No evidence of pulmonic stenosis. Aorta: The aortic root is normal in size and structure. Venous: The inferior vena cava is normal in size with greater than 50% respiratory variability, suggesting right atrial pressure of 3 mmHg. IAS/Shunts: No atrial level shunt detected by color flow Doppler. Additional Comments: 3D was performed not requiring image post processing on Navarro independent workstation and was indeterminate.  LEFT VENTRICLE PLAX 2D LVIDd:         3.30 cm LVIDs:         2.10 cm LV PW:         1.10 cm LV IVS:        1.60 cm LVOT diam:     2.00 cm LV SV:         30 LV SV Index:   19 LVOT Area:     3.14 cm  RIGHT VENTRICLE RV Basal diam:  3.60 cm RV Mid diam:    2.80 cm RV S prime:     11.50 cm/s TAPSE (M-mode): 1.7 cm LEFT ATRIUM              Index         RIGHT ATRIUM           Index LA diam:        4.60 cm  2.84 cm/m    RA Area:     24.10 cm LA Vol (A2C):   180.0 ml 111.06 ml/m  RA Volume:   75.80 ml  46.77 ml/m LA Vol (A4C):   64.8 ml  39.98 ml/m LA Biplane Vol: 112.0 ml 69.10 ml/m  AORTIC VALVE AV Area (Vmax):    1.80 cm AV Area (Vmean):   1.71 cm AV Area (VTI):     1.71 cm AV Vmax:           92.13 cm/s AV Vmean:          60.567 cm/s AV VTI:            0.177 m AV Peak Grad:      3.4 mmHg AV Mean Grad:      2.0 mmHg LVOT Vmax:         52.70 cm/s LVOT Vmean:        33.000 cm/s LVOT VTI:          0.096 m LVOT/AV VTI ratio: 0.55  AORTA Ao Root diam: 3.50 cm MITRAL VALVE                TRICUSPID VALVE MV Area (PHT): 4.19 cm     TR Peak grad:   30.5 mmHg MV Decel Time: 181 msec     TR Vmax:        276.00 cm/s MV E velocity: 100.00 cm/s  SHUNTS                             Systemic VTI:  0.10 m                             Systemic Diam: 2.00 cm Marsa Dooms MD Electronically signed by Marsa Dooms MD Signature Date/Time: 10/18/2023/1:38:37 PM    Final    DG Chest Portable 1 View Result Date: 10/17/2023 CLINICAL DATA:  Productive cough EXAM: PORTABLE CHEST 1 VIEW COMPARISON:  X-ray 09/06/2022 FINDINGS: New area of consolidative ill-defined opacity at the left lung base with tiny left effusion. Hyperinflation. No pneumothorax or edema. Normal cardiopericardial silhouette. Calcified aorta. Overlapping cardiac leads. Osteopenia. IMPRESSION: New opacity left lung base with a tiny left effusion. Infiltrate or pneumonia is possible. Recommend follow-up to confirm clearance. Electronically Signed   By: Ranell Bring M.D.   On: 10/17/2023 16:22    Assessment/Plan  1. Community acquired pneumonia of left lower lobe of lung Completed azithromycin , still with cough however no shortness of breath or fever.   2. Protein-calorie malnutrition, severe Ongoing, continues on remeron  daily To have protein supplement in addition to smallest meal of the day, encouraged 3 meals a day. Ongoing follow up with   3. Paroxysmal atrial fibrillation (HCC) (Primary) Rate controlled on metoprolol    4. Essential hypertension -Blood pressure well controlled, goal bp <140/90 Continue current medications and dietary modifications follow metabolic panel  5. Diabetes mellitus with peripheral angiopathy (HCC) Not currently on medication.   6.Constipation Stable, continues miralax daily   pt is stable for discharge-will need PT/OT per home health. Will need follow up with PCP within 2 weeks of discharge   Esparanza Krider K. Caro BODILY [facility] [phone]
